# Patient Record
Sex: Female | Born: 1985 | Race: Black or African American | Hispanic: No | Marital: Married | Smoking: Never smoker
Health system: Southern US, Community
[De-identification: ages and names within clinical notes are randomized; demographics above are authoritative.]

## PROBLEM LIST (undated history)

## (undated) ENCOUNTER — Inpatient Hospital Stay (HOSPITAL_COMMUNITY): Payer: Self-pay

## (undated) DIAGNOSIS — O24419 Gestational diabetes mellitus in pregnancy, unspecified control: Secondary | ICD-10-CM

## (undated) DIAGNOSIS — E282 Polycystic ovarian syndrome: Secondary | ICD-10-CM

## (undated) DIAGNOSIS — Z8279 Family history of other congenital malformations, deformations and chromosomal abnormalities: Secondary | ICD-10-CM

## (undated) DIAGNOSIS — T8859XA Other complications of anesthesia, initial encounter: Secondary | ICD-10-CM

## (undated) DIAGNOSIS — O358XX0 Maternal care for other (suspected) fetal abnormality and damage, not applicable or unspecified: Secondary | ICD-10-CM

## (undated) DIAGNOSIS — L680 Hirsutism: Secondary | ICD-10-CM

## (undated) DIAGNOSIS — T4145XA Adverse effect of unspecified anesthetic, initial encounter: Secondary | ICD-10-CM

## (undated) DIAGNOSIS — Z8619 Personal history of other infectious and parasitic diseases: Secondary | ICD-10-CM

## (undated) DIAGNOSIS — A609 Anogenital herpesviral infection, unspecified: Secondary | ICD-10-CM

## (undated) HISTORY — DX: Other complications of anesthesia, initial encounter: T88.59XA

## (undated) HISTORY — DX: Adverse effect of unspecified anesthetic, initial encounter: T41.45XA

## (undated) HISTORY — PX: DILATION AND CURETTAGE OF UTERUS: SHX78

## (undated) HISTORY — DX: Hirsutism: L68.0

## (undated) HISTORY — PX: TONSILLECTOMY: SHX5217

## (undated) HISTORY — DX: Anogenital herpesviral infection, unspecified: A60.9

## (undated) HISTORY — DX: Polycystic ovarian syndrome: E28.2

## (undated) HISTORY — DX: Family history of other congenital malformations, deformations and chromosomal abnormalities: Z82.79

## (undated) HISTORY — DX: Gestational diabetes mellitus in pregnancy, unspecified control: O24.419

## (undated) HISTORY — PX: TONSILLECTOMY: SUR1361

## (undated) HISTORY — DX: Personal history of other infectious and parasitic diseases: Z86.19

---

## 2012-01-29 ENCOUNTER — Other Ambulatory Visit (HOSPITAL_COMMUNITY): Payer: Self-pay | Admitting: Obstetrics & Gynecology

## 2012-01-29 DIAGNOSIS — N979 Female infertility, unspecified: Secondary | ICD-10-CM

## 2012-02-04 ENCOUNTER — Ambulatory Visit (HOSPITAL_COMMUNITY)
Admission: RE | Admit: 2012-02-04 | Discharge: 2012-02-04 | Disposition: A | Discharge status: Home / Self Care | Payer: BC Managed Care – PPO | Source: Ambulatory Visit | Attending: Obstetrics & Gynecology | Admitting: Obstetrics & Gynecology

## 2012-02-04 DIAGNOSIS — N979 Female infertility, unspecified: Secondary | ICD-10-CM | POA: Insufficient documentation

## 2012-02-04 MED ORDER — IOHEXOL 300 MG/ML  SOLN: 7.0000 mL | Freq: Once | INTRAMUSCULAR | Status: AC | PRN

## 2013-04-04 LAB — OB RESULTS CONSOLE HEPATITIS B SURFACE ANTIGEN: HEP B S AG: NEGATIVE

## 2013-04-04 LAB — OB RESULTS CONSOLE RUBELLA ANTIBODY, IGM: RUBELLA: IMMUNE

## 2013-04-04 LAB — OB RESULTS CONSOLE ABO/RH: RH Type: POSITIVE

## 2013-04-04 LAB — OB RESULTS CONSOLE HIV ANTIBODY (ROUTINE TESTING): HIV: NONREACTIVE

## 2013-04-04 LAB — OB RESULTS CONSOLE ANTIBODY SCREEN: ANTIBODY SCREEN: NEGATIVE

## 2013-04-04 LAB — OB RESULTS CONSOLE RPR: RPR: NONREACTIVE

## 2013-04-18 LAB — OB RESULTS CONSOLE GC/CHLAMYDIA
CHLAMYDIA, DNA PROBE: NEGATIVE
Gonorrhea: NEGATIVE

## 2013-04-21 NOTE — L&D Delivery Note (Signed)
Delivery Note At 2:29 PM a viable and healthy female was delivered via  (Presentation: OP ).  APGAR: 8 , 8; weight 6 lb 6 oz Thick meconium noted after birth but none in infant;s nares or mouth. Placenta status: spontaneous intact, small, sending to path.  Cord: no complications: .  Cord pH: not sent Anesthesia: Epidural  Episiotomy: none Lacerations: 1st degress vaginal  Suture Repair: 3.0 vicryl rapide Est. Blood Loss (mL): 250cc  Mom to postpartum.  Baby to Couplet care / Skin to Skin.  Eilee Schader R 11/23/2013, 2:51 PM

## 2013-06-15 ENCOUNTER — Encounter: Payer: BC Managed Care – PPO | Attending: Obstetrics & Gynecology

## 2013-06-15 VITALS — Ht 62.0 in | Wt 160.5 lb

## 2013-06-15 DIAGNOSIS — O99810 Abnormal glucose complicating pregnancy: Secondary | ICD-10-CM | POA: Insufficient documentation

## 2013-06-15 DIAGNOSIS — Z713 Dietary counseling and surveillance: Secondary | ICD-10-CM | POA: Insufficient documentation

## 2013-06-23 NOTE — Progress Notes (Signed)
  Patient was seen on 06/15/13 for Gestational Diabetes self-management class at the [facility]. The following learning objectives were met by the patient during this course:   States the definition of Gestational Diabetes  States why dietary management is important in controlling blood glucose  Describes the effects of carbohydrates on blood glucose levels  Demonstrates ability to create a balanced meal plan  Demonstrates carbohydrate counting   States when to check blood glucose levels  Demonstrates proper blood glucose monitoring techniques  States the effect of stress and exercise on blood glucose levels  States the importance of limiting caffeine and abstaining from alcohol and smoking  Plan:  Aim for 2 Carb Choices per meal (30 grams) +/- 1 either way for breakfast Aim for 3 Carb Choices per meal (45 grams) +/- 1 either way from lunch and dinner Aim for 1-2 Carbs per snack Begin reading food labels for Total Carbohydrate and sugar grams of foods Consider  increasing your activity level by walking daily as tolerated Begin checking BG before breakfast and 1-2 hours after first bit of breakfast, lunch and dinner after  as directed by MD  Take medication  as directed by MD  Blood glucose monitor given:  One Touch Ultra Mini Self Monitoring Kit Lot # [id number] X Exp: 11/2013 Blood glucose reading: 63m/dl  Patient instructed to monitor glucose levels: FBS: 60 - <90 1 hour: <140 2 hour: <120  Patient received the following handouts:  Nutrition Diabetes and Pregnancy  Carbohydrate Counting List  Meal Planning worksheet  Patient will be seen for follow-up as needed.

## 2013-08-03 ENCOUNTER — Other Ambulatory Visit: Payer: Self-pay

## 2013-08-05 ENCOUNTER — Encounter (HOSPITAL_COMMUNITY): Payer: Self-pay | Admitting: Obstetrics & Gynecology

## 2013-08-05 ENCOUNTER — Other Ambulatory Visit (HOSPITAL_COMMUNITY): Payer: Self-pay | Admitting: Obstetrics & Gynecology

## 2013-08-05 DIAGNOSIS — O358XX0 Maternal care for other (suspected) fetal abnormality and damage, not applicable or unspecified: Secondary | ICD-10-CM

## 2013-08-05 DIAGNOSIS — Z3689 Encounter for other specified antenatal screening: Secondary | ICD-10-CM

## 2013-08-05 DIAGNOSIS — IMO0002 Reserved for concepts with insufficient information to code with codable children: Secondary | ICD-10-CM

## 2013-08-12 ENCOUNTER — Encounter (HOSPITAL_COMMUNITY): Payer: BC Managed Care – PPO

## 2013-08-16 ENCOUNTER — Ambulatory Visit (HOSPITAL_COMMUNITY)
Admission: RE | Admit: 2013-08-16 | Discharge: 2013-08-16 | Disposition: A | Discharge status: Home / Self Care | Payer: BC Managed Care – PPO | Source: Ambulatory Visit | Attending: Obstetrics & Gynecology | Admitting: Obstetrics & Gynecology

## 2013-08-16 ENCOUNTER — Ambulatory Visit (HOSPITAL_COMMUNITY): Admission: RE | Admit: 2013-08-16 | Payer: BC Managed Care – PPO | Source: Ambulatory Visit

## 2013-08-16 ENCOUNTER — Encounter (HOSPITAL_COMMUNITY): Payer: Self-pay

## 2013-08-16 DIAGNOSIS — O24419 Gestational diabetes mellitus in pregnancy, unspecified control: Secondary | ICD-10-CM

## 2013-08-16 DIAGNOSIS — O35EXX0 Maternal care for other (suspected) fetal abnormality and damage, fetal genitourinary anomalies, not applicable or unspecified: Secondary | ICD-10-CM

## 2013-08-16 DIAGNOSIS — IMO0002 Reserved for concepts with insufficient information to code with codable children: Secondary | ICD-10-CM

## 2013-08-16 DIAGNOSIS — O99810 Abnormal glucose complicating pregnancy: Secondary | ICD-10-CM | POA: Insufficient documentation

## 2013-08-16 DIAGNOSIS — O358XX0 Maternal care for other (suspected) fetal abnormality and damage, not applicable or unspecified: Secondary | ICD-10-CM | POA: Insufficient documentation

## 2013-08-16 DIAGNOSIS — Z3689 Encounter for other specified antenatal screening: Secondary | ICD-10-CM

## 2013-08-16 NOTE — Consult Note (Signed)
MFM consult  28 yr old G2P0010 at 28w3dwith gestational diabetes A2 and finding of pyelectasis and echogenic bowel on outside ultrasound referred by Dr. MBenjie Karvonenfor fetal anatomic survey and consult.  Ultrasound today shows: single intrauterine pregnancy. Estimated fetal weight is in the 61st%. Posterior fundal placenta without evidence of previa. Normal amniotic fluid volume. Normal transabdominal cervical length. Echogenic bowel is seen. There is bilateral dilation of the renal pelves; the right measures 8.1151m the left measures 5.51m6mThere are some views suspicious for dilated ureter on the left. The bladder is normal; there is no calyceal dilation. The remainder of the anatomic survey is normal.  I counseled the patient as follows: 1. Appropriate fetal growth. 2. Echogenic bowel: I discussed the associations of echogenic bowel with fetal aneuploidy, cystic fibrosis, congenital infection, and bleeding.  Aneuploidy is found in 3-25% of fetuses with echogenic bowel.  Cystic fibrosis is found in 3-8% of fetuses with echogenic bowel.  There is a higher risk of fetal growth restriction in fetuses with echogenic bowel (up to 20%).  Congenital infections such as toxoplasmosis and CMV have also been associated with echogenic bowel.  - patient met with the genetic counselor; see separate report - per patient had normal first trimester screen; we do not have the results - toxoplasmosis and CMV were negative for IgM - cystic fibrosis is pending - recommend fetal growth every 4 weeks - patient deciding on cell free fetal DNA 3. Urinary tract dilation: discussed slight association with fetal aneuploidy; specifically trisomy 21. Discussed the etiology could be benign finding or variant of normal. Urinary tract dilation can also be caused by obstruction or vesicoureteral reflux. I discussed that the majority of the cases resolve antepartum or shortly after delivery. A small percentage of cases may require  prophylactic antibiotics, renal ultrasound, and VCUG, or surgical correction. I have recommended the patient follow up at 32 weeks to reevaluate the fetal kidneys.  - if there is ureteral dilation would be class 2-3, if not would be class 1 as pelves are <72m20md calyces are normal - recommend reevaluate at 32 weeks to determine class - if felt to be class 2-3 would recommend antenatal Pediatric urology consult - see above for genetic screening 4. Diabetes: Discussed increased risks in pregnancy include: fetal macrosomia, shoulder dystocia, and increased risk of requiring a Cesarean delivery. There is also an increased risk of developing preeclampsia during the pregnancy and an increased risk of type II diabetes in the future. I discussed there is an increased risk of stillbirth, neonatal hypoglycemia, neonatal jaundice, and neonatal electrolyte disturbances. I recommend strict glucose control maintaining fasting blood sugars <90 and 2 hour postprandial values <120. Patient reports good control on metformin. I recommend starting fetal kick counts at 28 weeks.. I recommend starting antenatal testing with either weekly biophysical profiles or twice weekly nonstress tests and weekly amniotic fluid index at 32 weeks.  I recommend following fetal growth every 4 weeks. I recommend delivery by estimated due date but not prior to 39 weeks in the absence of other complications according to ACOG guidelines. I recommend screening for diabetes 6 weeks postpartum.   I spent a total of 60 minutes with the patient of which >50% was spent in face to face consultation.  Please call with questions.  [place]

## 2013-08-16 NOTE — Progress Notes (Signed)
[facility] ultrasound  Indication: 28 yr old G2P0010 at 68w3dwith gestational diabetes A2 and finding of pyelectasis and echogenic bowel on outside ultrasound for fetal anatomic survey.  Findings: 1. Single intrauterine pregnancy. 2. Estimated fetal weight is in the 61st%. 3. Posterior fundal placenta without evidence of previa. 4. Normal amniotic fluid volume. 5. Normal transabdominal cervical length. 6. Echogenic bowel is seen. 7. There is bilateral dilation of the renal pelves; the right measures 8.166m the left measures 5.57m52mThere are some views suspicious for dilated ureter on the left. The bladder is normal; there is no calyceal dilation. 8. The remainder of the anatomic survey is normal.  Recommendations: 1. Appropriate fetal growth. 2. Echogenic bowel: - see consult letter - patient met with the genetic counselor; see separate report - per patient had normal first trimester screen; we do not have the results - toxoplasmosis and CMV were negative for IgM - cystic fibrosis is pending - recommend fetal growth every 4 weeks - patient deciding on cell free fetal DNA 3. Urinary tract dilation: - see consult letter - if there is ureteral dilation would be class 2-3, if not would be class 1 as pelves are <54m79md calyces are normal - recommend reevaluate at 32 weeks to determine class - if felt to be class 2-3 would recommend antenatal Pediatric urology consult - see above for genetic screening 4. Diabetes: - see consult letter - on metformin - recommend fetal growth as above - recommend antenatal testing starting at 32 weeks - recommend delivery by estimated due date but not prior to 39 weeks in the absence of other complications  [place]

## 2013-10-26 LAB — OB RESULTS CONSOLE GBS: GBS: NEGATIVE

## 2013-11-10 ENCOUNTER — Telehealth (HOSPITAL_COMMUNITY): Payer: Self-pay | Admitting: *Deleted

## 2013-11-10 ENCOUNTER — Other Ambulatory Visit: Payer: Self-pay | Admitting: Obstetrics & Gynecology

## 2013-11-10 ENCOUNTER — Encounter (HOSPITAL_COMMUNITY): Payer: Self-pay | Admitting: *Deleted

## 2013-11-10 NOTE — Telephone Encounter (Signed)
Preadmission screen  

## 2013-11-19 ENCOUNTER — Inpatient Hospital Stay (HOSPITAL_COMMUNITY)
Admission: AD | Admit: 2013-11-19 | Payer: BC Managed Care – PPO | Source: Ambulatory Visit | Admitting: Obstetrics & Gynecology

## 2013-11-21 ENCOUNTER — Telehealth (HOSPITAL_COMMUNITY): Payer: Self-pay | Admitting: *Deleted

## 2013-11-21 ENCOUNTER — Inpatient Hospital Stay (HOSPITAL_COMMUNITY): Admission: RE | Admit: 2013-11-21 | Payer: BC Managed Care – PPO | Source: Ambulatory Visit

## 2013-11-21 NOTE — Telephone Encounter (Signed)
Preadmission screen  

## 2013-11-22 ENCOUNTER — Inpatient Hospital Stay (HOSPITAL_COMMUNITY)
Admission: RE | Admit: 2013-11-22 | Discharge: 2013-11-25 | DRG: 774 | Disposition: A | Discharge status: Home / Self Care | Payer: BC Managed Care – PPO | Source: Ambulatory Visit | Attending: Obstetrics & Gynecology | Admitting: Obstetrics & Gynecology

## 2013-11-22 ENCOUNTER — Encounter (HOSPITAL_COMMUNITY): Payer: Self-pay

## 2013-11-22 DIAGNOSIS — O9903 Anemia complicating the puerperium: Secondary | ICD-10-CM | POA: Diagnosis present

## 2013-11-22 DIAGNOSIS — Z810 Family history of intellectual disabilities: Secondary | ICD-10-CM

## 2013-11-22 DIAGNOSIS — Z8279 Family history of other congenital malformations, deformations and chromosomal abnormalities: Secondary | ICD-10-CM

## 2013-11-22 DIAGNOSIS — A6000 Herpesviral infection of urogenital system, unspecified: Secondary | ICD-10-CM | POA: Diagnosis present

## 2013-11-22 DIAGNOSIS — Z823 Family history of stroke: Secondary | ICD-10-CM

## 2013-11-22 DIAGNOSIS — L680 Hirsutism: Secondary | ICD-10-CM | POA: Diagnosis present

## 2013-11-22 DIAGNOSIS — D62 Acute posthemorrhagic anemia: Secondary | ICD-10-CM | POA: Diagnosis present

## 2013-11-22 DIAGNOSIS — O98519 Other viral diseases complicating pregnancy, unspecified trimester: Secondary | ICD-10-CM | POA: Diagnosis present

## 2013-11-22 DIAGNOSIS — O24419 Gestational diabetes mellitus in pregnancy, unspecified control: Secondary | ICD-10-CM

## 2013-11-22 DIAGNOSIS — O99814 Abnormal glucose complicating childbirth: Principal | ICD-10-CM | POA: Diagnosis present

## 2013-11-22 DIAGNOSIS — Z833 Family history of diabetes mellitus: Secondary | ICD-10-CM

## 2013-11-22 DIAGNOSIS — Z3490 Encounter for supervision of normal pregnancy, unspecified, unspecified trimester: Secondary | ICD-10-CM

## 2013-11-22 DIAGNOSIS — E282 Polycystic ovarian syndrome: Secondary | ICD-10-CM | POA: Diagnosis present

## 2013-11-22 LAB — GLUCOSE, CAPILLARY
Glucose-Capillary: 87 mg/dL (ref 70–99)
Glucose-Capillary: 102 mg/dL — ABNORMAL HIGH (ref 70–99)

## 2013-11-22 LAB — CBC
HCT: 36.6 % (ref 36.0–46.0)
Hemoglobin: 12.3 g/dL (ref 12.0–15.0)
MCH: 31.9 pg (ref 26.0–34.0)
MCHC: 33.6 g/dL (ref 30.0–36.0)
MCV: 95.1 fL (ref 78.0–100.0)
PLATELETS: 165 10*3/uL (ref 150–400)
RBC: 3.85 MIL/uL — ABNORMAL LOW (ref 3.87–5.11)
RDW: 14.4 % (ref 11.5–15.5)
WBC: 9.9 10*3/uL (ref 4.0–10.5)

## 2013-11-22 LAB — RPR

## 2013-11-22 MED ORDER — FLEET ENEMA 7-19 GM/118ML RE ENEM: 1.0000 | ENEMA | RECTAL | Status: DC | PRN

## 2013-11-22 MED ORDER — OXYTOCIN 40 UNITS IN LACTATED RINGERS INFUSION - SIMPLE MED
62.5000 mL/h | INTRAVENOUS | Status: DC
  Administered 2013-11-23: 62.5 mL/h via INTRAVENOUS
  Filled 2013-11-22: qty 1000

## 2013-11-22 MED ORDER — CITRIC ACID-SODIUM CITRATE 334-500 MG/5ML PO SOLN: 30.0000 mL | ORAL | Status: DC | PRN

## 2013-11-22 MED ORDER — BUTORPHANOL TARTRATE 1 MG/ML IJ SOLN
1.0000 mg | INTRAMUSCULAR | Status: DC | PRN
  Administered 2013-11-23: 1 mg via INTRAVENOUS
  Filled 2013-11-22: qty 1

## 2013-11-22 MED ORDER — OXYCODONE-ACETAMINOPHEN 5-325 MG PO TABS: 1.0000 | ORAL_TABLET | ORAL | Status: DC | PRN

## 2013-11-22 MED ORDER — LACTATED RINGERS IV SOLN
INTRAVENOUS | Status: DC
  Administered 2013-11-22 – 2013-11-23 (×4): via INTRAVENOUS

## 2013-11-22 MED ORDER — OXYTOCIN 40 UNITS IN LACTATED RINGERS INFUSION - SIMPLE MED
1.0000 m[IU]/min | INTRAVENOUS | Status: DC
  Administered 2013-11-22: 1 m[IU]/min via INTRAVENOUS
  Filled 2013-11-22: qty 1000

## 2013-11-22 MED ORDER — TERBUTALINE SULFATE 1 MG/ML IJ SOLN: 0.2500 mg | Freq: Once | INTRAMUSCULAR | Status: AC | PRN

## 2013-11-22 MED ORDER — LIDOCAINE HCL (PF) 1 % IJ SOLN
30.0000 mL | INTRAMUSCULAR | Status: DC | PRN
  Administered 2013-11-23: 30 mL via SUBCUTANEOUS
  Filled 2013-11-22: qty 30

## 2013-11-22 MED ORDER — OXYTOCIN BOLUS FROM INFUSION
500.0000 mL | INTRAVENOUS | Status: DC
  Administered 2013-11-23: 500 mL via INTRAVENOUS

## 2013-11-22 MED ORDER — ACETAMINOPHEN 325 MG PO TABS: 650.0000 mg | ORAL_TABLET | ORAL | Status: DC | PRN

## 2013-11-22 MED ORDER — MISOPROSTOL 25 MCG QUARTER TABLET
25.0000 ug | ORAL_TABLET | ORAL | Status: DC | PRN
  Administered 2013-11-22 (×3): 25 ug via VAGINAL
  Filled 2013-11-22: qty 1
  Filled 2013-11-22 (×3): qty 0.25

## 2013-11-22 MED ORDER — LACTATED RINGERS IV SOLN: 500.0000 mL | INTRAVENOUS | Status: DC | PRN

## 2013-11-22 MED ORDER — IBUPROFEN 600 MG PO TABS: 600.0000 mg | ORAL_TABLET | Freq: Four times a day (QID) | ORAL | Status: DC | PRN

## 2013-11-22 MED ORDER — ONDANSETRON HCL 4 MG/2ML IJ SOLN: 4.0000 mg | Freq: Four times a day (QID) | INTRAMUSCULAR | Status: DC | PRN

## 2013-11-22 NOTE — Progress Notes (Signed)
Patient ID: Deanna Robbins, female   DOB: January 13, 1986, 28 y.o.   MRN: [id number] Subjective: IOL for A2 GDM. Doing well. 3rd Cytotec just placed. Has contractions without any pain. No bleeding.   Objective: BP 130/70  Pulse 85  Temp(Src) 98.2 F (36.8 C) (Oral)  Resp 18  Ht 5\' 2"  (1.575 m)  Wt 180 lb (81.647 kg)  BMI 32.91 kg/m2  LMP 02/12/2013  FHT:  FHR: 130s bpm, variability: moderate,  accelerations:  Present,  decelerations:  Absent UC:   irregular, every 2-6 minutes, VTX on palpation  SVE:   Dilation: Closed Effacement (%): 20 Station: -3 Exam by:: lee   Assessment / Plan: Induction of labor due to gestational diabetes,  progressing well on pitocin. Plan pitocin after 4 hrs up and not continue Cytotec after 3rd dose. May consider foley bulb if dilates a bit  Fetal Wellbeing:  Category I EFW 7 lbs Pain Control:  None at present.   Anticipated MOD:  Attempting vaginal birth.   Deanna Robbins R 11/22/2013, 6:10 PM

## 2013-11-22 NOTE — H&P (Signed)
Deanna Robbins is a 28 y.o. female G2P0 at 40/3 wks with A2GDM, here for labor induction.  PCOS pt, infertility, On Metformin, Femara/ Ovidrel pregnancy. [facility], from 6 wks. Took Metformin in 1st trim 500mg  bid, then dropped to 250mg  bid and failed GLucola, so was back on 500mg  bid from mid pregnancy and has kept good control of BS.  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 Saw MFM, similar findings at 24 wks, declined fetal aneuploidy testing.  F/up sono at 30 wks noted pylectasis and echogenic bowel resolved.  Last sono for growth at 36 wks, EFW 6'6" at 62%, AGA, nl AFI and Vtx ANtesting with NST reactive.   History OB History   Grav Para Term Preterm Abortions TAB SAB Ect Mult Living   2 0 0 0 1 0 1 0 0 0      Past Medical History  Diagnosis Date  . Gestational diabetes mellitus, antepartum   . Gestational diabetes   . Hx of varicella   . PCOS (polycystic ovarian syndrome)   . Hirsutism   . Family history of congenital anomalies     spina bifida   Past Surgical History  Procedure Laterality Date  . Tonsillectomy    . Tonsillectomy     Family History: family history includes Cancer in her maternal aunt and other; Diabetes in her father and other; Mental retardation in her other; Sleep apnea in her other; Spina bifida in her other; Stroke in her other. Social History:  reports that she has never smoked. She has never used smokeless tobacco. She reports that she does not drink alcohol or use illicit drugs.   Prenatal Transfer Tool  Maternal Diabetes: Yes:  Diabetes Type:  Insulin/Medication controlled Metformin 500mg  bid  Genetic Screening: Declined Maternal Ultrasounds/Referrals: Abnormal:  Findings:   Fetal Kidney Anomalies, Echogenic bowel Fetal Ultrasounds or other Referrals:  Referred  to Materal Fetal Medicine  Pylectasis (bilat) and echogenic bowel, no other markers at 24 wks, all resolved at 30 wks.  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 On Valtrex since 36 wks  ROS   Neg    Dilation: Closed Effacement (%): Thick Station: Ballotable Exam by:: Montez Morita, RNC Blood pressure 141/78, pulse 91, temperature 98.4 F (36.9 C), temperature source Oral, resp. rate 18, height 5\' 2"  (1.575 m), weight 180 lb (81.647 kg), last menstrual period 02/12/2013. Exam Physical Exam  A&O x 3, no acute distress. Pleasant HEENT neg, no thyromegaly Lungs CTA bilat CV RRR, S1S2 normal Abdo soft, non tender, non acute Extr no edema/ tenderness Pelvic as above FHT  130s/ + accels/ no decels/mod variab, category I Toco q 3 min  Prenatal labs: ABO, Rh: O/Positive/-- (12/15 0000) Antibody: Negative (12/15 0000) Rubella: Immune (12/15 0000) RPR: Nonreactive (12/15 0000)  HBsAg: Negative (12/15 0000)  HIV: Non-reactive (12/15 0000)  GBS: Negative (07/08 0000)   Assessment/Plan: 28 yo, G2P0 at 40.3 wks with A2GDM, here for labor IOL. EFW 7 lbs, GBS neg.  Cytotec, then pitocin after 4 hrs if needed and ok. Station is high/ ballotable, assess descent as labor progresses.  [name] R 11/22/2013, 1:15 PM

## 2013-11-23 ENCOUNTER — Inpatient Hospital Stay (HOSPITAL_COMMUNITY): Payer: BC Managed Care – PPO | Admitting: Anesthesiology

## 2013-11-23 ENCOUNTER — Encounter (HOSPITAL_COMMUNITY): Payer: BC Managed Care – PPO | Admitting: Anesthesiology

## 2013-11-23 ENCOUNTER — Encounter (HOSPITAL_COMMUNITY): Payer: Self-pay

## 2013-11-23 DIAGNOSIS — O24419 Gestational diabetes mellitus in pregnancy, unspecified control: Secondary | ICD-10-CM

## 2013-11-23 LAB — GLUCOSE, CAPILLARY
Glucose-Capillary: 88 mg/dL (ref 70–99)
Glucose-Capillary: 91 mg/dL (ref 70–99)
Glucose-Capillary: 72 mg/dL (ref 70–99)

## 2013-11-23 LAB — ABO/RH: ABO/RH(D): O POS

## 2013-11-23 MED ORDER — SIMETHICONE 80 MG PO CHEW: 80.0000 mg | CHEWABLE_TABLET | ORAL | Status: DC | PRN

## 2013-11-23 MED ORDER — FENTANYL 2.5 MCG/ML BUPIVACAINE 1/10 % EPIDURAL INFUSION (WH - ANES)
INTRAMUSCULAR | Status: DC | PRN
  Administered 2013-11-23: 14 mL/h via EPIDURAL

## 2013-11-23 MED ORDER — FENTANYL 2.5 MCG/ML BUPIVACAINE 1/10 % EPIDURAL INFUSION (WH - ANES)
INTRAMUSCULAR | Status: DC
Start: 2013-11-23 — End: 2013-11-23
  Filled 2013-11-23: qty 125

## 2013-11-23 MED ORDER — WITCH HAZEL-GLYCERIN EX PADS: 1.0000 "application " | MEDICATED_PAD | CUTANEOUS | Status: DC | PRN

## 2013-11-23 MED ORDER — FENTANYL 2.5 MCG/ML BUPIVACAINE 1/10 % EPIDURAL INFUSION (WH - ANES)
14.0000 mL/h | INTRAMUSCULAR | Status: DC | PRN
  Administered 2013-11-23: 14 mL/h via EPIDURAL

## 2013-11-23 MED ORDER — LACTATED RINGERS IV SOLN
500.0000 mL | Freq: Once | INTRAVENOUS | Status: AC
  Administered 2013-11-23: 500 mL via INTRAVENOUS

## 2013-11-23 MED ORDER — ONDANSETRON HCL 4 MG/2ML IJ SOLN: 4.0000 mg | INTRAMUSCULAR | Status: DC | PRN

## 2013-11-23 MED ORDER — ONDANSETRON HCL 4 MG PO TABS: 4.0000 mg | ORAL_TABLET | ORAL | Status: DC | PRN

## 2013-11-23 MED ORDER — BENZOCAINE-MENTHOL 20-0.5 % EX AERO
1.0000 "application " | INHALATION_SPRAY | CUTANEOUS | Status: DC | PRN
  Administered 2013-11-23: 1 via TOPICAL
  Filled 2013-11-23: qty 56

## 2013-11-23 MED ORDER — PHENYLEPHRINE 40 MCG/ML (10ML) SYRINGE FOR IV PUSH (FOR BLOOD PRESSURE SUPPORT)
PREFILLED_SYRINGE | INTRAVENOUS | Status: AC
  Filled 2013-11-23: qty 10

## 2013-11-23 MED ORDER — TETANUS-DIPHTH-ACELL PERTUSSIS 5-2.5-18.5 LF-MCG/0.5 IM SUSP: 0.5000 mL | Freq: Once | INTRAMUSCULAR | Status: DC

## 2013-11-23 MED ORDER — OXYCODONE-ACETAMINOPHEN 5-325 MG PO TABS: 1.0000 | ORAL_TABLET | ORAL | Status: DC | PRN

## 2013-11-23 MED ORDER — FENTANYL CITRATE 0.05 MG/ML IJ SOLN
100.0000 ug | INTRAMUSCULAR | Status: DC | PRN
  Administered 2013-11-23: 100 ug via INTRAVENOUS
  Filled 2013-11-23: qty 2

## 2013-11-23 MED ORDER — DIPHENHYDRAMINE HCL 50 MG/ML IJ SOLN: 12.5000 mg | INTRAMUSCULAR | Status: DC | PRN

## 2013-11-23 MED ORDER — PRENATAL MULTIVITAMIN CH
1.0000 | ORAL_TABLET | Freq: Every day | ORAL | Status: DC
  Administered 2013-11-24 – 2013-11-25 (×2): 1 via ORAL
  Filled 2013-11-23 (×2): qty 1

## 2013-11-23 MED ORDER — PHENYLEPHRINE 40 MCG/ML (10ML) SYRINGE FOR IV PUSH (FOR BLOOD PRESSURE SUPPORT)
80.0000 ug | PREFILLED_SYRINGE | INTRAVENOUS | Status: DC | PRN
  Filled 2013-11-23: qty 2

## 2013-11-23 MED ORDER — DIBUCAINE 1 % RE OINT: 1.0000 "application " | TOPICAL_OINTMENT | RECTAL | Status: DC | PRN

## 2013-11-23 MED ORDER — IBUPROFEN 600 MG PO TABS
600.0000 mg | ORAL_TABLET | Freq: Four times a day (QID) | ORAL | Status: DC
  Administered 2013-11-23 – 2013-11-25 (×8): 600 mg via ORAL
  Filled 2013-11-23 (×8): qty 1

## 2013-11-23 MED ORDER — FENTANYL 2.5 MCG/ML BUPIVACAINE 1/10 % EPIDURAL INFUSION (WH - ANES)
14.0000 mL/h | INTRAMUSCULAR | Status: DC | PRN
  Administered 2013-11-23: 14 mL/h via EPIDURAL
  Filled 2013-11-23: qty 125

## 2013-11-23 MED ORDER — DIPHENHYDRAMINE HCL 25 MG PO CAPS: 25.0000 mg | ORAL_CAPSULE | Freq: Four times a day (QID) | ORAL | Status: DC | PRN

## 2013-11-23 MED ORDER — LIDOCAINE HCL (PF) 1 % IJ SOLN
INTRAMUSCULAR | Status: DC | PRN
  Administered 2013-11-23 (×2): 4 mL

## 2013-11-23 MED ORDER — SENNOSIDES-DOCUSATE SODIUM 8.6-50 MG PO TABS
2.0000 | ORAL_TABLET | ORAL | Status: DC
  Administered 2013-11-24 – 2013-11-25 (×2): 2 via ORAL
  Filled 2013-11-23 (×2): qty 2

## 2013-11-23 MED ORDER — LANOLIN HYDROUS EX OINT: TOPICAL_OINTMENT | CUTANEOUS | Status: DC | PRN

## 2013-11-23 MED ORDER — ZOLPIDEM TARTRATE 5 MG PO TABS: 5.0000 mg | ORAL_TABLET | Freq: Every evening | ORAL | Status: DC | PRN

## 2013-11-23 MED ORDER — EPHEDRINE 5 MG/ML INJ
10.0000 mg | INTRAVENOUS | Status: DC | PRN
  Filled 2013-11-23: qty 2

## 2013-11-23 NOTE — Progress Notes (Signed)
Deanna Robbins is a 28 y.o. G2P0010 at 2667w4d, here for labor IOL since 8/4 am for A2-GDM.  C/o rectal pressure sicne she was 4-5 cm dilated and back pain, needing to top off epidural.   Pt is s/p Cytotec x 3 since yesterday morning and on pitocin since this morning, now at 8 mu. Epidural f/by AROM with minimal fluid at 5.30 am. Since then progressed well and is now anterior lip. But station progress is poor, baby in ROT position with non rotation, trying exaggerated Sims on left FHT category I except once prolonged decel (5-6 minutes) that was position change related and resolved after correction position f/by category I again, slight change in baseline from 130s to 140s without loss of variability or maternal fever.  BS well controlled.   A/P: A2GDM (Metformin), well controlled, EFW 7 lbs, IOL since 8/4 am. Active labor at 9+ cm with ROT position and station still at -2. Rotate patient to allow descent. FHT category I.  Anticipate SVD if rotation and descent successful. Patient aware of high station at this point.  Will discuss with Dr Cherly Hensenousins who comes on call at 1 pm.    Nam Vossler R 11/23/2013, 12:41 PM

## 2013-11-23 NOTE — Anesthesia Preprocedure Evaluation (Signed)
Anesthesia Evaluation  Patient identified by MRN, date of birth, ID band Patient awake    Reviewed: Allergy & Precautions, H&P , NPO status , Patient's Chart, lab work & pertinent test results  Airway Mallampati: II TM Distance: >3 FB Neck ROM: Full    Dental no notable dental hx.    Pulmonary neg pulmonary ROS,  breath sounds clear to auscultation  Pulmonary exam normal       Cardiovascular negative cardio ROS  Rhythm:Regular Rate:Normal     Neuro/Psych negative neurological ROS  negative psych ROS   GI/Hepatic negative GI ROS, Neg liver ROS,   Endo/Other  diabetes, Gestational, Oral Hypoglycemic Agents  Renal/GU negative Renal ROS     Musculoskeletal negative musculoskeletal ROS (+)   Abdominal   Peds  Hematology negative hematology ROS (+)   Anesthesia Other Findings   Reproductive/Obstetrics (+) Pregnancy                           Anesthesia Physical Anesthesia Plan  ASA: II  Anesthesia Plan: Epidural   Post-op Pain Management:    Induction:   Airway Management Planned:   Additional Equipment:   Intra-op Plan:   Post-operative Plan:   Informed Consent: I have reviewed the patients History and Physical, chart, labs and discussed the procedure including the risks, benefits and alternatives for the proposed anesthesia with the patient or authorized representative who has indicated his/her understanding and acceptance.     Plan Discussed with:   Anesthesia Plan Comments:         Anesthesia Quick Evaluation

## 2013-11-23 NOTE — Progress Notes (Signed)
Patient ID: Deanna Robbins, female   DOB: 1986/01/04, 28 y.o.   MRN: [id number] Subjective: Doing well, S/p epidural.   Objective: BP 125/73  Pulse 75  Temp(Src) 98.9 F (37.2 C) (Oral)  Resp 18  Ht 5\' 2"  (1.575 m)  Wt 180 lb (81.647 kg)  BMI 32.91 kg/m2  SpO2 99%  LMP 02/12/2013  FHT:  FHR: 130s bpm, variability: moderate,  accelerations:  Present,  decelerations:  Absent UC:   regular, every 3-5 minutes SVE:   Dilation: 3.5 Effacement (%): 100 Station: -3;-2 Exam by:: Dr Juliene PinaMody Cx well applied to head. AROM with no fluid.   Assessment / Plan: Induction of labor due to gestational diabetes,  progressing well on pitocin  Fetal Wellbeing:  Category I Pain Control:  Epidural  Anticipated MOD:  NSVD  Jaquil Todt R 11/23/2013, 6:44 AM

## 2013-11-23 NOTE — Anesthesia Procedure Notes (Signed)
Epidural Patient location during procedure: OB  Staffing Anesthesiologist: Kinze Labo R Performed by: anesthesiologist   Preanesthetic Checklist Completed: patient identified, pre-op evaluation, timeout performed, IV checked, risks and benefits discussed and monitors and equipment checked  Epidural Patient position: sitting Prep: site prepped and draped and DuraPrep Patient monitoring: heart rate Approach: midline Location: L3-L4 Injection technique: LOR air and LOR saline  Needle:  Needle type: Tuohy  Needle gauge: 17 G Needle length: 9 cm Needle insertion depth: 5 cm Catheter type: closed end flexible Catheter size: 19 Gauge Catheter at skin depth: 11 cm Test dose: negative  Assessment Sensory level: T8 Events: blood not aspirated, injection not painful, no injection resistance, negative IV test and no paresthesia  Additional Notes Reason for block:procedure for pain   

## 2013-11-24 LAB — CBC
HEMATOCRIT: 31.5 % — AB (ref 36.0–46.0)
HEMOGLOBIN: 10.4 g/dL — AB (ref 12.0–15.0)
MCH: 31.8 pg (ref 26.0–34.0)
MCHC: 33 g/dL (ref 30.0–36.0)
MCV: 96.3 fL (ref 78.0–100.0)
Platelets: 160 10*3/uL (ref 150–400)
RBC: 3.27 MIL/uL — AB (ref 3.87–5.11)
RDW: 14.8 % (ref 11.5–15.5)
WBC: 14.8 10*3/uL — AB (ref 4.0–10.5)

## 2013-11-24 MED ORDER — OXYCODONE-ACETAMINOPHEN 5-325 MG PO TABS: 1.0000 | ORAL_TABLET | ORAL | Status: DC | PRN

## 2013-11-24 NOTE — Anesthesia Postprocedure Evaluation (Signed)
  Anesthesia Post-op Note  Anesthesia Post Note  Patient: Deanna FiscalKelley [place]  Procedure(s) Performed: * No procedures listed *  Anesthesia type: Epidural  Patient location: Mother/Baby  Post pain: Pain level controlled  Post assessment: Post-op Vital signs reviewed  Last Vitals:  Filed Vitals:   11/24/13 0511  BP: 104/64  Pulse: 70  Temp: 36.9 C  Resp: 18    Post vital signs: Reviewed  Level of consciousness:alert  Complications: No apparent anesthesia complications

## 2013-11-24 NOTE — Lactation Note (Signed)
This note was copied from the chart of Deanna Robbins-[place]. Lactation Consultation Note New mom w/PCOS/hirsutism. DEBP set up. Mom shown how to use DEBP & how to disassemble, clean, & reassemble parts. Hand expression taught w/noted colostrum. Mom has Lg. Long nipples w/bouncy areolas. Baby latches well. Wide space noted between breast w/tubular shaped breast tissue. Tender to massage. Mom encouraged to feed baby 8-12 times/24 hours and with feeding cues.  Educated about newborn behavior. Mom reports + breast changes w/pregnancy been leaking since 4-5 month. Encouraged to call for assistance if needed and to verify proper latch.WH/LC brochure given w/resources, support groups and LC services.Referred to Baby and Me Book in Breastfeeding section Pg. 22-23 for position options and Proper latch demonstration.Encouraged comfort during BF so colostrum flows better and mom will enjoy the feeding longer. Taking deep breaths and breast massage during BF. Mom encouraged to waken baby for feeds. Mom knows to pump q3h for 10-15 min. Post-pumping.  Patient Name: Deanna [place] ZOXWR'UToday's Date: 11/24/2013 Reason for consult: Initial assessment   Maternal Data    Feeding Feeding Type: Breast Fed Length of feed: 15 min  LATCH Score/Interventions Latch: Grasps breast easily, tongue down, lips flanged, rhythmical sucking. Intervention(s): Skin to skin;Teach feeding cues;Waking techniques Intervention(s): Adjust position;Assist with latch;Breast massage;Breast compression  Audible Swallowing: A few with stimulation Intervention(s): Skin to skin Intervention(s): Hand expression;Alternate breast massage  Type of Nipple: Everted at rest and after stimulation  Comfort (Breast/Nipple): Soft / non-tender     Hold (Positioning): Assistance needed to correctly position infant at breast and maintain latch. Intervention(s): Breastfeeding basics reviewed;Support Pillows;Position options;Skin to  skin  LATCH Score: 8  Lactation Tools Discussed/Used Tools: Pump Breast pump type: Double-Electric Breast Pump Pump Review: Setup, frequency, and cleaning;Milk Storage Initiated by:: Peri JeffersonL. Loann Chahal RN Date initiated:: 11/24/13   Consult Status Consult Status: Follow-up Date: 11/24/13 Follow-up type: In-patient    Harel Repetto, Diamond NickelLAURA G 11/24/2013, 7:00 AM

## 2013-11-24 NOTE — Progress Notes (Signed)
PPD 1 SVD  S:  Reports feeling tired - no sleep yet             Tolerating po/ No nausea or vomiting             Bleeding is light             Pain controlled with motrin and percocet             Up ad lib / ambulatory / voiding QS  Newborn breast feeding  / Circumcision planned today  O:               VS: BP 104/64  Pulse 70  Temp(Src) 98.4 F (36.9 C) (Oral)  Resp 18  Ht 5\' 2"  (1.575 m)  Wt 81.647 kg (180 lb)  BMI 32.91 kg/m2  SpO2 99%  LMP 02/12/2013  Breastfeeding? Unknown   LABS:              Recent Labs  11/22/13 0820 11/24/13 0610  WBC 9.9 14.8*  HGB 12.3 10.4*  PLT 165 160               Blood type: --/--/O POS (08/04 0820)  Rubella: Immune (12/15 0000)                     I&O: Intake/Output     08/05 0701 - 08/06 0700 08/06 0701 - 08/07 0700   Blood 250    Total Output 250     Net -250                        Physical Exam:             Alert and oriented X3  Abdomen: soft, non-tender, non-distended              Fundus: firm, non-tender, U-1  Perineum: mild edema / ice pack in place  Lochia: light  Extremities: no edema, no calf pain or tenderness    A: PPD # 1              Mild ABL anemia - stable  Doing well - stable status  P: Routine post partum orders  anticipate DC in am  Deanna Robbins, Deanna Robbins CNM, MSN, [facility]ACNM 11/24/2013, 8:32 AM

## 2013-11-24 NOTE — Lactation Note (Signed)
This note was copied from the chart of Deanna Robbins. Lactation Consultation Note  Patient Name: Deanna Robbins ZOXWR'UToday's Date: 11/24/2013 Reason for consult: Follow-up assessment  Baby 27 hours of life. Mom reports nursing going well. Mom states that she has been post-pumping after each breastfeed. Enc mom to continue with post-pumping. Mom states that she isn't really seeing colostrum. Enc mom to hand express colostrum prior to latching baby. Demonstrated to mom how to flange baby's lower lip outward by tugging chin. Mom reports that she isn't having any breast/nipple discomfort. Discussed cluster-feeding with mom and to continue to feed with cues. Discussed OP/BFSG services and LC phone line for assistance. [facility] to [facility] for number of diapers to expect by day of life for baby. Enc mom to listen for swallows while nursing as well.  Maternal Data    Feeding Feeding Type:  ([facility] Length of feed: 45 min  LATCH Score/Interventions Latch: Grasps breast easily, tongue down, lips flanged, rhythmical sucking.  Audible Swallowing: Spontaneous and intermittent  Type of Nipple: Everted at rest and after stimulation  Comfort (Breast/Nipple): Soft / non-tender     Hold (Positioning): Assistance needed to correctly position infant at breast and maintain latch.  LATCH Score: 8  Lactation Tools Discussed/Used     Consult Status Consult Status: Follow-up Date: 11/25/13 Follow-up type: In-patient    Deanna Robbins, Garik Diamant 11/24/2013, 5:50 PM

## 2013-11-25 MED ORDER — POLYSACCHARIDE IRON COMPLEX 150 MG PO CAPS: 150.0000 mg | ORAL_CAPSULE | Freq: Every day | ORAL | Status: DC

## 2013-11-25 MED ORDER — IBUPROFEN 600 MG PO TABS: 600.0000 mg | ORAL_TABLET | Freq: Four times a day (QID) | ORAL | Status: DC

## 2013-11-25 NOTE — Progress Notes (Addendum)
PPD #2- SVD  Subjective:   Reports feeling well Tolerating po/ No nausea or vomiting Bleeding is light Pain controlled with Motrin Up ad lib / ambulatory / voiding without problems Newborn: breastfeeding  / Circumcision: done   Objective:   VS: VS:  Filed Vitals:   11/23/13 2157 11/24/13 0511 11/24/13 1750 11/25/13 0545  BP: 121/56 104/64 118/61 124/68  Pulse: 79 70 81 82  Temp: 98.6 F (37 C) 98.4 F (36.9 C) 98.6 F (37 C) 98.3 F (36.8 C)  TempSrc: Oral Oral Oral Oral  Resp: 18 18 20 18   Height:      Weight:      SpO2:  99%      LABS:  Recent Labs  11/24/13 0610  WBC 14.8*  HGB 10.4*  PLT 160   Blood type: --/--/O POS (08/04 0820) Rubella: Immune (12/15 0000)                I&O: Intake/Output     08/06 0701 - 08/07 0700 08/07 0701 - 08/08 0700   Blood     Total Output       Net              Physical Exam: Alert and oriented X3 Abdomen: soft, non-tender, non-distended  Fundus: firm, non-tender, U-1 Perineum: Well approximated, no significant erythema, edema, or drainage; healing well. Lochia: small Extremities: Trace BLE edema, no calf pain or tenderness    Assessment: PPD # 2 G2P1011/ S/P:spontaneous vaginal, 1st degree laceration Mild ABL anemia A2GDM, delivered-stable Doing well - stable for discharge home   Plan: Discharge home RX's:  Ibuprofen 600mg  po Q 6 hrs prn pain #30 Refill x 0 Niferex 150mg  po QD #30 Refill x 1 Routine pp visit in 6wks and glucose test 6-12 wks in office [facility] booklet given    Donette LarryBHAMBRI, Maurissa Ambrose, N MSN, CNM 11/25/2013, 10:08 AM

## 2013-11-25 NOTE — Discharge Summary (Signed)
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 Negative Homan's sign. No cords or calf tenderness. No significant calf/ankle edema.  Discharge Diagnoses: Term Pregnancy-delivered; GDM, delivered; ABL anemia  Discharge Information: Date: 11/25/2013 Activity: pelvic rest Diet: routine Medications: PNV, Ibuprofen and Iron Condition: stable Instructions: refer to practice specific booklet Discharge to: home Follow-up Information   Follow up with MODY,VAISHALI R, MD. Schedule an appointment as soon as possible for a visit in 6 weeks. (and glucose test in 6-12 weeks in office)    Specialty:  Obstetrics and Gynecology   Contact information:   [address] G[address] [place] [id number] [phone]       Newborn Data: Live born female on [birth year] Birth Weight: 6 lb 6.1 oz (2895 g) APGAR: 8, 8  Home with mother.  Deanna Robbins, N 11/25/2013, 11:16 AM

## 2013-12-09 ENCOUNTER — Ambulatory Visit (HOSPITAL_COMMUNITY)
Admission: RE | Admit: 2013-12-09 | Discharge: 2013-12-09 | Disposition: A | Discharge status: Home / Self Care | Payer: BC Managed Care – PPO | Source: Ambulatory Visit | Attending: Obstetrics & Gynecology | Admitting: Obstetrics & Gynecology

## 2013-12-09 NOTE — Lactation Note (Signed)
Lactation Consult  Mother's reason for  visit:  Follow up weight and feeding assessment Visit Type   Lactation   Appointment Notes:   Consult: follow up Lactation Consultant:  Alfred LevinsLee, Mahdiya Mossberg Anne  ________________________________________________________________________  Baby's Name: Donnella ShamKaleb Cassoday  Date of Birth: 11/23/2013  Pediatrician: Dr. Hyacinth MeekerMiller Gender: female  Gestational Age: 3429w4d (At Birth)  Birth Weight: 6 lb 6.1 oz (2895 g)  Weight at Discharge: Weight: 6 lb 3.1 oz (2810 g) Date of Discharge: 11/25/2013  [facility]   11/23/13 1429 11/24/13 0012 11/25/13 0010  Weight: 6 lb 6.1 oz (2895 g) 6 lb 5.1 oz (2865 g) 6 lb 3.1 oz (2810 g)  [facility]:  Location:Hospital   Wt 6 lbs 8 oz  On 12/02/13     Lowest weight was 5 lbs 13 oz Weight today:  7 lbs 9.5 oz   ________________________________________________________________________  Mother's Name: Carolyne FiscalKelley K [place] Type of delivery:  vaginal Breastfeeding Experience:  First baby Maternal Medical Conditions:  Polycystic ovarian syndrome Maternal Medications:  metforman 500  mg  Daily - 1/2 pill 2x day     Mom has history of PCOS  ________________________________________________________________________  Breastfeeding History (Post Discharge)     Kaleb's lowest weight after birth was 5 lbs 13 oz. He is now 7 lbs 9.5 ounces, and 5216 days old.  Mom is pumping 3-4 ounces 4 times a day, in addition to feeding baby on cue. He feeds sometime no bottles of EBM, and some days 1-2 bottles of 3-4 ounces EBM. He transferred 70 mls in 12 minutes at the breast today, and was very content.   Frequency of breastfeeding:  8 plus times a day - feeds with cues Duration of feeding:  30-40 minutes each feeding  Supplementation  none  l     2  Infant Intake and Output Assessment  Voids:  8-10 in 24 hrs.  Color:  Clear yellow Stools:  2-3 in 24 hrs.  Color:   Yellow  ________________________________________________________________________  Maternal Breast Assessment  Breast:  Full Nipple:  Erect Pain level:  0 Pain interventions:  none  _______________________________________________________________________ Feeding Assessment/Evaluation   Mom and Rhae LernerKaleb are doing very well with breast feeding. He is gaining weight well, and appears healthy. I told mom to pump if she wants to store  Some EBM, or if very full, but otherwise, just continue breast feeding him on cue.  Initial feeding assessment:  Infant's oral assessment:  WNL  Positioning:  Cradle Right breast  LATCH documentation:  Latch:  2 = Grasps breast easily, tongue down, lips flanged, rhythmical sucking.  Audible swallowing:  2 = Spontaneous and intermittent  Type of nipple:  2 = Everted at rest and after stimulation  Comfort (Breast/Nipple):  2 = Soft / non-tender  Hold (Positioning):  2 = No assistance needed to correctly position infant at breast  LATCH score:  10  Attached assessment:  Deep  Lips flanged:  Yes.    Lips untucked:  Yes.    Suck assessment:  Nutritive  Pre-feed weight: 3444 g  (7 lb. 9.5 oz.) Post-feed weight:3514   g (9 lb.11.8 oz.) Amount transferred: 70 ml Amount supplemented:  0 ml    Total amount pumped post feed:  R    L  Did not need to pump  Total amount transferred:  70 ml Total supplement given:  0 ml

## 2014-02-20 ENCOUNTER — Encounter (HOSPITAL_COMMUNITY): Payer: Self-pay

## 2014-04-05 ENCOUNTER — Emergency Department (HOSPITAL_COMMUNITY)
Admission: EM | Admit: 2014-04-05 | Discharge: 2014-04-05 | Disposition: A | Discharge status: Home / Self Care | Payer: BC Managed Care – PPO | Source: Home / Self Care | Attending: Emergency Medicine | Admitting: Emergency Medicine

## 2014-04-05 ENCOUNTER — Encounter (HOSPITAL_COMMUNITY): Payer: Self-pay | Admitting: Emergency Medicine

## 2014-04-05 DIAGNOSIS — M2662 Arthralgia of temporomandibular joint: Secondary | ICD-10-CM

## 2014-04-05 DIAGNOSIS — M26629 Arthralgia of temporomandibular joint, unspecified side: Secondary | ICD-10-CM

## 2014-04-05 NOTE — ED Provider Notes (Signed)
CSN: [id number]     Arrival date & time 04/05/14  [id number] History   First MD Initiated Contact with Patient 04/05/14 [phone]     Chief Complaint  Patient presents with  . Jaw Pain   (Consider location/radiation/quality/duration/timing/severity/associated sxs/prior Treatment) HPI Comments: 28 year old female complaining of right jaw pain for 3 days. Gradual onset. Denies toothache, fever, chills, sore throat or tongue pain. She states that the pain is deeper back, right side of her jaw beyond her teeth. Pain is worse with opening and closing the jaw and chewing. No known injury/trauma.   Past Medical History  Diagnosis Date  . Gestational diabetes mellitus, antepartum   . Gestational diabetes   . Hx of varicella   . PCOS (polycystic ovarian syndrome)   . Hirsutism   . Family history of congenital anomalies     spina bifida   Past Surgical History  Procedure Laterality Date  . Tonsillectomy    . Tonsillectomy     Family History  Problem Relation Age of Onset  . Cancer Other   . Diabetes Other   . Stroke Other   . Sleep apnea Other   . Diabetes Father   . Cancer Maternal Aunt     stomach  . Spina bifida Other     nephew  . Mental retardation Other     nephew   History  Substance Use Topics  . Smoking status: Never Smoker   . Smokeless tobacco: Never Used  . Alcohol Use: No   OB History    Gravida Para Term Preterm AB TAB SAB Ectopic Multiple Living   2 1 1  0 1 0 1 0 0 1     Review of Systems  Constitutional: Negative.   HENT: Negative.  Negative for dental problem.   Respiratory: Negative.   All other systems reviewed and are negative.   Allergies  Review of patient's allergies indicates no known allergies.  Home Medications   Prior to Admission medications   Medication Sig Start Date End Date Taking? Authorizing Provider  ibuprofen (ADVIL,MOTRIN) 600 MG tablet Take 1 tablet (600 mg total) by mouth every 6 (six) hours. 11/25/13   Lawernce PittsMelanie N Bhambri, CNM  iron  polysaccharides (NIFEREX) 150 MG capsule Take 1 capsule (150 mg total) by mouth daily. 11/25/13   Lawernce PittsMelanie N Bhambri, CNM  Prenat w/o A-FeCb-FeGl-DSS-FA (CITRANATAL RX PO) Take 1 each by mouth daily.    Historical Provider, MD   BP 151/96 mmHg  Pulse 96  Temp(Src) 99 F (37.2 C) (Oral)  Resp 16  SpO2 98%  LMP 03/17/2014 Physical Exam  Constitutional: She is oriented to person, place, and time. She appears well-developed and well-nourished. No distress.  HENT:  Mouth/Throat: No oropharyngeal exudate.  Bilateral TMs are normal Oropharynx with scant PND and cobblestoning. No dental tenderness. Positive tenderness to the pterygoid muscle. Tenderness to the right external jaw at the TMJ. Limitation in range of motion for opening the jaw has decreased. No trismus.  Eyes: Conjunctivae and EOM are normal.  Neck: Normal range of motion. Neck supple.  Pulmonary/Chest: Effort normal.  Lymphadenopathy:    She has no cervical adenopathy.  Neurological: She is alert and oriented to person, place, and time.  Skin: Skin is warm and dry.  Psychiatric: She has a normal mood and affect.  Nursing note and vitals reviewed.   ED Course  Procedures (including critical care time) Labs Review Labs Reviewed - No data to display  Imaging Review No results found.  MDM   1. TMJ arthralgia    Discussed bruxism, tension, stress, malocclusion. Ice for swelling Ibuprofen small doses q 8h prn while breastfeeding.  See dentinst or PCP if persistent.    Hayden Rasmussenavid Nykeem Citro, NP 04/05/14 1007

## 2014-04-05 NOTE — ED Notes (Signed)
C/o pain on right side of jaw onset 4 days Sx also include swelling Denies fevers, chills Alert, no signs of acute distress.

## 2014-04-05 NOTE — Discharge Instructions (Signed)
Temporomandibular Problems  Temporomandibular joint (TMJ) dysfunction means there are problems with the joint between your jaw and your skull. This is a joint lined by cartilage like other joints in your body but also has a small disc in the joint which keeps the bones from rubbing on each other. These joints are like other joints and can get inflamed (sore) from arthritis and other problems. When this joint gets sore, it can cause headaches and pain in the jaw and the face. CAUSES  Usually the arthritic types of problems are caused by soreness in the joint. Soreness in the joint can also be caused by overuse. This may come from grinding your teeth. It may also come from mis-alignment in the joint. DIAGNOSIS Diagnosis of this condition can often be made by history and exam. Sometimes your caregiver may need X-rays or an MRI scan to determine the exact cause. It may be necessary to see your dentist to determine if your teeth and jaws are lined up correctly. TREATMENT  Most of the time this problem is not serious; however, sometimes it can persist (become chronic). When this happens medications that will cut down on inflammation (soreness) help. Sometimes a shot of cortisone into the joint will be helpful. If your teeth are not aligned it may help for your dentist to make a splint for your mouth that can help this problem. If no physical problems can be found, the problem may come from tension. If tension is found to be the cause, biofeedback or relaxation techniques may be helpful. HOME CARE INSTRUCTIONS   Later in the day, applications of ice packs may be helpful. Ice can be used in a plastic bag with a towel around it to prevent frostbite to skin. This may be used about every 2 hours for 20 to 30 minutes, as needed while awake, or as directed by your caregiver.  Only take over-the-counter or prescription medicines for pain, discomfort, or fever as directed by your caregiver.  If physical therapy was  prescribed, follow your caregiver's directions.  Wear mouth appliances as directed if they were given. Document Released: 12/31/2000 Document Revised: 06/30/2011 Document Reviewed: 04/09/2008 ExitCare Patient Information 2015 [employer]. This information is not intended to replace advice given to you by your health care provider. Make sure you discuss any questions you have with your health care provider.  

## 2016-07-21 LAB — OB RESULTS CONSOLE GC/CHLAMYDIA
Chlamydia: NEGATIVE
Gonorrhea: NEGATIVE

## 2016-07-21 LAB — OB RESULTS CONSOLE HEPATITIS B SURFACE ANTIGEN: Hepatitis B Surface Ag: NEGATIVE

## 2016-07-21 LAB — OB RESULTS CONSOLE ABO/RH: RH TYPE: POSITIVE

## 2016-07-21 LAB — OB RESULTS CONSOLE ANTIBODY SCREEN: ANTIBODY SCREEN: NEGATIVE

## 2016-07-21 LAB — OB RESULTS CONSOLE HIV ANTIBODY (ROUTINE TESTING): HIV: NONREACTIVE

## 2016-07-21 LAB — OB RESULTS CONSOLE RUBELLA ANTIBODY, IGM: RUBELLA: IMMUNE

## 2016-07-21 LAB — OB RESULTS CONSOLE RPR: RPR: NONREACTIVE

## 2016-11-20 ENCOUNTER — Inpatient Hospital Stay (HOSPITAL_COMMUNITY)
Admission: AD | Admit: 2016-11-20 | Discharge: 2016-11-20 | Disposition: A | Discharge status: Home / Self Care | Payer: BLUE CROSS/BLUE SHIELD | Source: Ambulatory Visit | Attending: Obstetrics & Gynecology | Admitting: Obstetrics & Gynecology

## 2016-11-20 ENCOUNTER — Encounter (HOSPITAL_COMMUNITY): Payer: Self-pay | Admitting: *Deleted

## 2016-11-20 DIAGNOSIS — Z3A28 28 weeks gestation of pregnancy: Secondary | ICD-10-CM | POA: Insufficient documentation

## 2016-11-20 DIAGNOSIS — O368130 Decreased fetal movements, third trimester, not applicable or unspecified: Secondary | ICD-10-CM | POA: Diagnosis not present

## 2016-11-20 DIAGNOSIS — R82998 Other abnormal findings in urine: Secondary | ICD-10-CM

## 2016-11-20 DIAGNOSIS — N9489 Other specified conditions associated with female genital organs and menstrual cycle: Secondary | ICD-10-CM

## 2016-11-20 LAB — URINALYSIS, ROUTINE W REFLEX MICROSCOPIC
BILIRUBIN URINE: NEGATIVE
GLUCOSE, UA: 50 mg/dL — AB
Hgb urine dipstick: NEGATIVE
Ketones, ur: NEGATIVE mg/dL
NITRITE: NEGATIVE
PH: 7 (ref 5.0–8.0)
Protein, ur: NEGATIVE mg/dL
Specific Gravity, Urine: 1.003 — ABNORMAL LOW (ref 1.005–1.030)

## 2016-11-20 NOTE — MAU Note (Signed)
Patient was seen in ob office today for c/o decreased movement. Was having contractions during nst.  Sent over for further evaluation. Patient denies pain, vaginal bleeding, or discharge at this time. +FM

## 2016-11-20 NOTE — Discharge Instructions (Signed)

## 2016-11-20 NOTE — MAU Provider Note (Signed)
Chief Complaint:  Contractions   First Provider Initiated Contact with Patient 11/20/16 1753     HPI: Deanna Robbins is a 31 y.o. G3P1011 at 7028w0dwho presents to maternity admissions reporting decreased fetal movement today. .Was noted to be having some contractions on NST in office. Cervix was checked but FFn not done.  She was sent here for further monitoring and recheck of cervix. She reports good fetal movement now,  denies LOF, vaginal bleeding, vaginal itching/burning, urinary symptoms, h/a, dizziness, n/v, diarrhea, constipation or fever/chills.    Other  This is a new problem. The current episode started today. The problem has been gradually improving. Pertinent negatives include no abdominal pain, chills, fever, headaches, myalgias, nausea, urinary symptoms or vomiting. Nothing aggravates the symptoms. She has tried nothing for the symptoms.    RN Note: Patient was seen in ob office today for c/o decreased movement. Was having contractions during nst.  Sent over for further evaluation. Patient denies pain, vaginal bleeding, or discharge at this time. +FM   Past Medical History: Past Medical History:  Diagnosis Date  . Family history of congenital anomalies    spina bifida  . Gestational diabetes   . Gestational diabetes mellitus, antepartum   . Hirsutism   . Hx of varicella   . PCOS (polycystic ovarian syndrome)     Past obstetric history: OB History  Gravida Para Term Preterm AB Living  3 1 1  0 1 1  SAB TAB Ectopic Multiple Live Births  1 0 0 0 1    # Outcome Date GA Lbr Len/2nd Weight Sex Delivery Anes PTL Lv  3 Current           2 Term 11/23/13 6074w4d 02:45 / 01:22 6 lb 6.1 oz (2.895 kg) M Vag-Spont EPI  LIV     Birth Comments: caput  1 SAB 2014              Past Surgical History: Past Surgical History:  Procedure Laterality Date  . DILATION AND CURETTAGE OF UTERUS    . TONSILLECTOMY    . TONSILLECTOMY      Family History: Family History  Problem  Relation Age of Onset  . Cancer Other   . Diabetes Other   . Stroke Other   . Sleep apnea Other   . Diabetes Father   . Cancer Maternal Aunt        stomach  . Spina bifida Other        nephew  . Mental retardation Other        nephew    Social History: Social History  Substance Use Topics  . Smoking status: Never Smoker  . Smokeless tobacco: Never Used  . Alcohol use No    Allergies: No Known Allergies  Meds:  Prescriptions Prior to Admission  Medication Sig Dispense Refill Last Dose  . ibuprofen (ADVIL,MOTRIN) 600 MG tablet Take 1 tablet (600 mg total) by mouth every 6 (six) hours. 30 tablet 0 Unknown at Unknown time  . iron polysaccharides (NIFEREX) 150 MG capsule Take 1 capsule (150 mg total) by mouth daily. 30 capsule 1 Unknown at Unknown time  . Prenat w/o A-FeCb-FeGl-DSS-FA (CITRANATAL RX PO) Take 1 each by mouth daily.   Unknown at Unknown time    I have reviewed patient's Past Medical Hx, Surgical Hx, Family Hx, Social Hx, medications and allergies.   ROS:  Review of Systems  Constitutional: Negative for chills and fever.  Gastrointestinal: Negative for abdominal pain, nausea and vomiting.  Musculoskeletal: Negative for myalgias.  Neurological: Negative for headaches.   Other systems negative  Physical Exam  Patient Vitals for the past 24 hrs:  BP Temp Temp src Pulse Resp SpO2 Weight  11/20/16 1731 124/63 98.2 F (36.8 C) Oral 90 17 98 % 172 lb 0.6 oz (78 kg)   Constitutional: Well-developed, well-nourished female in no acute distress.  Cardiovascular: normal rate and rhythm Respiratory: normal effort, clear to auscultation bilaterally GI: Abd soft, non-tender, gravid appropriate for gestational age.   No rebound or guarding. MS: Extremities nontender, no edema, normal ROM Neurologic: Alert and oriented x 4.  GU: Neg CVAT.  PELVIC EXAM:   Cervix is long and closed, ballotable, soft.  Unable to palpate presenting part   FHT:  Baseline 135 , moderate  variability, accelerations present, no decelerations Contractions: q 3 mins Irregular, not felt by patient at all.     Labs:    Results for orders placed or performed during the hospital encounter of 11/20/16 (from the past 24 hour(s))  Urinalysis, Routine w reflex microscopic     Status: Abnormal   Collection Time: 11/20/16  5:29 PM  Result Value Ref Range   Color, Urine YELLOW YELLOW   APPearance HAZY (A) CLEAR   Specific Gravity, Urine 1.003 (L) 1.005 - 1.030   pH 7.0 5.0 - 8.0   Glucose, UA 50 (A) NEGATIVE mg/dL   Hgb urine dipstick NEGATIVE NEGATIVE   Bilirubin Urine NEGATIVE NEGATIVE   Ketones, ur NEGATIVE NEGATIVE mg/dL   Protein, ur NEGATIVE NEGATIVE mg/dL   Nitrite NEGATIVE NEGATIVE   Leukocytes, UA SMALL (A) NEGATIVE   RBC / HPF 0-5 0 - 5 RBC/hpf   WBC, UA 6-30 0 - 5 WBC/hpf   Bacteria, UA MANY (A) NONE SEEN   Squamous Epithelial / LPF 6-30 (A) NONE SEEN   Amorphous Crystal PRESENT    Sperm, UA PRESENT      Imaging:  No results found.  MAU Course/MDM: I have ordered labs and reviewed results. UA mostly negative but does have small leukocytes. Sent for culture.   +sperm, ? Source of cramping NST reviewed and found to be reassuring for gestational age Consult Dr Juliene PinaMody with presentation, exam findings and test results.  Treatments in MAU included NST/observation/po hydration.    Assessment: Single IUP at 253w1d Uterine cramping - Plan: Discharge patient  Leukocytes in urine - Plan: Discharge patient  Decreased fetal movement affecting management of pregnancy in third trimester, single or unspecified fetus - now moving well - Plan: Discharge patient   Plan: Discharge home Urine to culture Pelvic rest for next few days PO hydration Preterm Labor precautions and fetal kick counts Follow up in Office for prenatal visits and recheck of status, has appt in a few days  Encouraged to return here or to other Urgent Care/ED if she develops worsening of symptoms,  increase in pain, fever, or other concerning symptoms.   Pt stable at time of discharge.  Wynelle BourgeoisMarie Camrie Stock CNM, MSN Certified Nurse-Midwife 11/20/2016 5:53 PM

## 2016-11-22 LAB — CULTURE, OB URINE

## 2017-01-02 ENCOUNTER — Other Ambulatory Visit (HOSPITAL_COMMUNITY): Payer: Self-pay | Admitting: Obstetrics & Gynecology

## 2017-01-02 DIAGNOSIS — Z3A35 35 weeks gestation of pregnancy: Secondary | ICD-10-CM

## 2017-01-02 DIAGNOSIS — Z3689 Encounter for other specified antenatal screening: Secondary | ICD-10-CM

## 2017-01-02 DIAGNOSIS — IMO0002 Reserved for concepts with insufficient information to code with codable children: Secondary | ICD-10-CM

## 2017-01-13 ENCOUNTER — Ambulatory Visit (HOSPITAL_COMMUNITY): Admission: RE | Admit: 2017-01-13 | Payer: BLUE CROSS/BLUE SHIELD | Source: Ambulatory Visit

## 2017-01-13 ENCOUNTER — Encounter (HOSPITAL_COMMUNITY): Payer: Self-pay

## 2017-01-13 ENCOUNTER — Ambulatory Visit (HOSPITAL_COMMUNITY)
Admission: RE | Admit: 2017-01-13 | Discharge: 2017-01-13 | Disposition: A | Discharge status: Home / Self Care | Payer: BLUE CROSS/BLUE SHIELD | Source: Ambulatory Visit | Attending: Obstetrics & Gynecology | Admitting: Obstetrics & Gynecology

## 2017-01-13 DIAGNOSIS — Z3A35 35 weeks gestation of pregnancy: Secondary | ICD-10-CM | POA: Diagnosis not present

## 2017-01-13 DIAGNOSIS — Z363 Encounter for antenatal screening for malformations: Secondary | ICD-10-CM | POA: Diagnosis not present

## 2017-01-13 DIAGNOSIS — O358XX0 Maternal care for other (suspected) fetal abnormality and damage, not applicable or unspecified: Secondary | ICD-10-CM | POA: Diagnosis not present

## 2017-01-13 DIAGNOSIS — IMO0002 Reserved for concepts with insufficient information to code with codable children: Secondary | ICD-10-CM

## 2017-01-13 DIAGNOSIS — Z3689 Encounter for other specified antenatal screening: Secondary | ICD-10-CM

## 2017-01-16 ENCOUNTER — Encounter (HOSPITAL_COMMUNITY): Payer: Self-pay

## 2017-01-16 LAB — OB RESULTS CONSOLE GBS: GBS: POSITIVE

## 2017-02-03 ENCOUNTER — Telehealth (HOSPITAL_COMMUNITY): Payer: Self-pay | Admitting: *Deleted

## 2017-02-03 ENCOUNTER — Encounter (HOSPITAL_COMMUNITY): Payer: Self-pay | Admitting: *Deleted

## 2017-02-03 NOTE — Telephone Encounter (Signed)
Preadmission screen  

## 2017-02-05 ENCOUNTER — Other Ambulatory Visit: Payer: Self-pay | Admitting: Obstetrics and Gynecology

## 2017-02-05 ENCOUNTER — Other Ambulatory Visit: Payer: Self-pay | Admitting: Obstetrics & Gynecology

## 2017-02-05 NOTE — H&P (Signed)
Deanna Robbins is a 31 y.o. female presenting for labor IOL for low AFI, 39 wks.  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 Saw MFM. Normal 3hr GTT.  Last office sono 38 wks - EFW 7'12" at 79% and AC 98%, But HC/BPD 2-3% and FL 53%. AFI 6.5 cm. Vx. Suspect LGA and overall at 79% possible from small BPD/HC. HSV hx- no recent outbreaks/ none in preg. On Valtrex  G3P1011, 1 SAB, 1 tern SVD 6'6" Boy. H/o GDM.   OB History    Gravida Para Term Preterm AB Living   3 1 1  0 1 1   SAB TAB Ectopic Multiple Live Births   1 0 0 0 1     Past Medical History:  Diagnosis Date  . Complication of anesthesia    fentanyl made her hot and nauseated  . Family history of congenital anomalies    spina bifida  . Gestational diabetes    last pregnancy  . Gestational diabetes mellitus, antepartum   . Hirsutism   . HSV (herpes simplex virus) anogenital infection   . Hx of varicella   . PCOS (polycystic ovarian syndrome)    Past Surgical History:  Procedure Laterality Date  . DILATION AND CURETTAGE OF UTERUS    . TONSILLECTOMY    . TONSILLECTOMY     Family History: family history includes Diabetes in her father; Sleep apnea in her father; Spina bifida in her other; Stroke in her father. Social History:  reports that she has never smoked. She has never used smokeless tobacco. She reports that she does not drink alcohol or use drugs.     Maternal Diabetes: No- passed 3hr GTT Genetic Screening: NT/Ultrascreen normal. AFP1 normal.  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 Otherwise there are  no sonographic markers for aneuploidy or structural anomalies. However, evaluations should be considered suboptimal secondary to late EGA.   Amniotic fluid volume is normal, Estimated fetal weight is 2925g which is growth in the 77th percentile ---------------------------------------------------------------------- Recommendations  Discussed findings with patient. No prenatal interventions are  necessary, but urologic evaluation in the postnatal period is  necessary ----------------------------------------------------------------------                ROS  Neg  History   Last menstrual period 04/12/2016, unknown if currently breastfeeding. Exam Physical Exam   BP 126/78 (BP Location: Right Arm)   Pulse 78   Temp 98.3 F (36.8 C) (Oral)   Resp 18   Ht 5\' 2"  (1.575 m)   Wt 183 lb (83 kg)   LMP 04/12/2016   BMI 33.47 kg/m  A&O x 3, no acute distress. Pleasant HEENT neg, no thyromegaly Lungs CTA bilat CV RRR, S1S2 normal Abdo soft, non tender, non acute Extr no edema/ tenderness Pelvic Cx closed, long. High station.  FHT  140s/ category I Toco regular since Cytotec x 1 dose   Prenatal labs: ABO, Rh: O/Positive/-- (04/02 0000) Antibody: Negative (04/02 0000) Rubella: Immune (04/02 0000)  RPR: Nonreactive (04/02 0000)  HBsAg: Negative (04/02 0000)  HIV: Non-reactive (04/02 0000)  GBS: Positive (09/28 0000)   Assessment/Plan: 31 yo G3P1011,at 39 wks, labor IOL for low AFI. Cytotec. GBS(+). Needs PCN.  EFW 7'1/2 - 8 lbs with large AC (not sure if related to renal duplex system on left or possible LGA. Small HC and BPD with large AC has been reviewed in office, if labor not progressing well, possible need for C/section, risk of shoulder dystocia reviewed. Low threshold for C/section, pt accepts and agrees.  Fetal  renal anomaly. Needs Peds f/up.  Shea Evans R 02/05/2017, 7:11 PM

## 2017-02-06 ENCOUNTER — Inpatient Hospital Stay (HOSPITAL_COMMUNITY): Payer: BLUE CROSS/BLUE SHIELD

## 2017-02-06 ENCOUNTER — Encounter (HOSPITAL_COMMUNITY): Payer: Self-pay

## 2017-02-06 ENCOUNTER — Encounter (HOSPITAL_COMMUNITY)
Admission: RE | Disposition: A | Discharge status: Home / Self Care | Payer: Self-pay | Source: Ambulatory Visit | Attending: Obstetrics & Gynecology

## 2017-02-06 ENCOUNTER — Inpatient Hospital Stay (HOSPITAL_COMMUNITY)
Admission: RE | Admit: 2017-02-06 | Discharge: 2017-02-08 | DRG: 787 | Disposition: A | Discharge status: Home / Self Care | Payer: BLUE CROSS/BLUE SHIELD | Source: Ambulatory Visit | Attending: Obstetrics & Gynecology | Admitting: Obstetrics & Gynecology

## 2017-02-06 DIAGNOSIS — O358XX0 Maternal care for other (suspected) fetal abnormality and damage, not applicable or unspecified: Principal | ICD-10-CM | POA: Diagnosis present

## 2017-02-06 DIAGNOSIS — O3663X0 Maternal care for excessive fetal growth, third trimester, not applicable or unspecified: Secondary | ICD-10-CM | POA: Diagnosis present

## 2017-02-06 DIAGNOSIS — O99824 Streptococcus B carrier state complicating childbirth: Secondary | ICD-10-CM | POA: Diagnosis present

## 2017-02-06 DIAGNOSIS — O9832 Other infections with a predominantly sexual mode of transmission complicating childbirth: Secondary | ICD-10-CM | POA: Diagnosis present

## 2017-02-06 DIAGNOSIS — O26893 Other specified pregnancy related conditions, third trimester: Secondary | ICD-10-CM | POA: Diagnosis present

## 2017-02-06 DIAGNOSIS — A6000 Herpesviral infection of urogenital system, unspecified: Secondary | ICD-10-CM | POA: Diagnosis present

## 2017-02-06 DIAGNOSIS — Z3A39 39 weeks gestation of pregnancy: Secondary | ICD-10-CM | POA: Diagnosis not present

## 2017-02-06 DIAGNOSIS — O322XX0 Maternal care for transverse and oblique lie, not applicable or unspecified: Secondary | ICD-10-CM | POA: Diagnosis present

## 2017-02-06 DIAGNOSIS — Z3490 Encounter for supervision of normal pregnancy, unspecified, unspecified trimester: Secondary | ICD-10-CM | POA: Diagnosis present

## 2017-02-06 DIAGNOSIS — O35EXX0 Maternal care for other (suspected) fetal abnormality and damage, fetal genitourinary anomalies, not applicable or unspecified: Secondary | ICD-10-CM

## 2017-02-06 HISTORY — DX: Maternal care for other (suspected) fetal abnormality and damage, fetal genitourinary anomalies, not applicable or unspecified: O35.EXX0

## 2017-02-06 HISTORY — DX: Maternal care for other (suspected) fetal abnormality and damage, not applicable or unspecified: O35.8XX0

## 2017-02-06 LAB — CBC
HCT: 37.3 % (ref 36.0–46.0)
HEMOGLOBIN: 13 g/dL (ref 12.0–15.0)
MCH: 33.7 pg (ref 26.0–34.0)
MCHC: 34.9 g/dL (ref 30.0–36.0)
MCV: 96.6 fL (ref 78.0–100.0)
Platelets: 190 10*3/uL (ref 150–400)
RBC: 3.86 MIL/uL — AB (ref 3.87–5.11)
RDW: 13 % (ref 11.5–15.5)
WBC: 11.9 10*3/uL — ABNORMAL HIGH (ref 4.0–10.5)

## 2017-02-06 LAB — TYPE AND SCREEN
ABO/RH(D): O POS
Antibody Screen: NEGATIVE

## 2017-02-06 LAB — RPR: RPR: NONREACTIVE

## 2017-02-06 SURGERY — Surgical Case
Anesthesia: Spinal | Site: Abdomen | Wound class: Clean Contaminated

## 2017-02-06 MED ORDER — MORPHINE SULFATE (PF) 0.5 MG/ML IJ SOLN
INTRAMUSCULAR | Status: DC | PRN
  Administered 2017-02-06: .2 mg via INTRATHECAL

## 2017-02-06 MED ORDER — PENICILLIN G POT IN DEXTROSE 60000 UNIT/ML IV SOLN
3.0000 10*6.[IU] | INTRAVENOUS | Status: DC
  Administered 2017-02-06 (×3): 3 10*6.[IU] via INTRAVENOUS
  Filled 2017-02-06 (×6): qty 50

## 2017-02-06 MED ORDER — DIBUCAINE 1 % RE OINT: 1.0000 "application " | TOPICAL_OINTMENT | RECTAL | Status: DC | PRN

## 2017-02-06 MED ORDER — DEXAMETHASONE SODIUM PHOSPHATE 10 MG/ML IJ SOLN
INTRAMUSCULAR | Status: DC | PRN
  Administered 2017-02-06: 10 mg via INTRAVENOUS

## 2017-02-06 MED ORDER — LACTATED RINGERS IV SOLN
INTRAVENOUS | Status: DC
  Administered 2017-02-06 (×5): via INTRAVENOUS

## 2017-02-06 MED ORDER — HYDROMORPHONE HCL 1 MG/ML IJ SOLN
0.2500 mg | INTRAMUSCULAR | Status: DC | PRN
  Administered 2017-02-06: 0.5 mg via INTRAVENOUS

## 2017-02-06 MED ORDER — DIPHENHYDRAMINE HCL 25 MG PO CAPS: 25.0000 mg | ORAL_CAPSULE | Freq: Four times a day (QID) | ORAL | Status: DC | PRN

## 2017-02-06 MED ORDER — DEXAMETHASONE SODIUM PHOSPHATE 10 MG/ML IJ SOLN
INTRAMUSCULAR | Status: AC
  Filled 2017-02-06: qty 1

## 2017-02-06 MED ORDER — KETOROLAC TROMETHAMINE 30 MG/ML IJ SOLN
INTRAMUSCULAR | Status: AC
  Administered 2017-02-06: 30 mg via INTRAMUSCULAR
  Filled 2017-02-06: qty 1

## 2017-02-06 MED ORDER — FENTANYL CITRATE (PF) 100 MCG/2ML IJ SOLN
INTRAMUSCULAR | Status: AC
  Filled 2017-02-06: qty 2

## 2017-02-06 MED ORDER — SCOPOLAMINE 1 MG/3DAYS TD PT72
MEDICATED_PATCH | TRANSDERMAL | Status: DC | PRN
  Administered 2017-02-06: 1 via TRANSDERMAL

## 2017-02-06 MED ORDER — ACETAMINOPHEN 325 MG PO TABS
650.0000 mg | ORAL_TABLET | ORAL | Status: DC | PRN
  Administered 2017-02-06 – 2017-02-08 (×6): 650 mg via ORAL
  Filled 2017-02-06 (×5): qty 2

## 2017-02-06 MED ORDER — KETOROLAC TROMETHAMINE 30 MG/ML IJ SOLN: 30.0000 mg | Freq: Four times a day (QID) | INTRAMUSCULAR | Status: DC

## 2017-02-06 MED ORDER — MORPHINE SULFATE (PF) 0.5 MG/ML IJ SOLN
INTRAMUSCULAR | Status: AC
  Filled 2017-02-06: qty 10

## 2017-02-06 MED ORDER — ONDANSETRON HCL 4 MG/2ML IJ SOLN
INTRAMUSCULAR | Status: AC
  Filled 2017-02-06: qty 2

## 2017-02-06 MED ORDER — ONDANSETRON HCL 4 MG/2ML IJ SOLN: 4.0000 mg | Freq: Four times a day (QID) | INTRAMUSCULAR | Status: DC | PRN

## 2017-02-06 MED ORDER — ZOLPIDEM TARTRATE 5 MG PO TABS: 5.0000 mg | ORAL_TABLET | Freq: Every evening | ORAL | Status: DC | PRN

## 2017-02-06 MED ORDER — PRENATAL MULTIVITAMIN CH
1.0000 | ORAL_TABLET | Freq: Every day | ORAL | Status: DC
  Administered 2017-02-07 – 2017-02-08 (×2): 1 via ORAL
  Filled 2017-02-06 (×2): qty 1

## 2017-02-06 MED ORDER — TERBUTALINE SULFATE 1 MG/ML IJ SOLN: 0.2500 mg | Freq: Once | INTRAMUSCULAR | Status: DC | PRN

## 2017-02-06 MED ORDER — MENTHOL 3 MG MT LOZG: 1.0000 | LOZENGE | OROMUCOSAL | Status: DC | PRN

## 2017-02-06 MED ORDER — COCONUT OIL OIL
1.0000 "application " | TOPICAL_OIL | Status: DC | PRN
  Administered 2017-02-08: 1 via TOPICAL
  Filled 2017-02-06: qty 120

## 2017-02-06 MED ORDER — SIMETHICONE 80 MG PO CHEW
80.0000 mg | CHEWABLE_TABLET | Freq: Three times a day (TID) | ORAL | Status: DC
  Administered 2017-02-07 – 2017-02-08 (×4): 80 mg via ORAL
  Filled 2017-02-06 (×3): qty 1

## 2017-02-06 MED ORDER — ONDANSETRON HCL 4 MG/2ML IJ SOLN
INTRAMUSCULAR | Status: DC | PRN
  Administered 2017-02-06: 4 mg via INTRAVENOUS

## 2017-02-06 MED ORDER — OXYTOCIN 10 UNIT/ML IJ SOLN
INTRAMUSCULAR | Status: AC
  Filled 2017-02-06: qty 4

## 2017-02-06 MED ORDER — IBUPROFEN 600 MG PO TABS
600.0000 mg | ORAL_TABLET | Freq: Four times a day (QID) | ORAL | Status: DC
  Administered 2017-02-07 – 2017-02-08 (×6): 600 mg via ORAL
  Filled 2017-02-06 (×6): qty 1

## 2017-02-06 MED ORDER — PHENYLEPHRINE 8 MG IN D5W 100 ML (0.08MG/ML) PREMIX OPTIME
INJECTION | INTRAVENOUS | Status: AC
  Filled 2017-02-06: qty 100

## 2017-02-06 MED ORDER — MISOPROSTOL 25 MCG QUARTER TABLET
25.0000 ug | ORAL_TABLET | ORAL | Status: DC | PRN
  Administered 2017-02-06: 25 ug via VAGINAL
  Filled 2017-02-06 (×2): qty 1

## 2017-02-06 MED ORDER — OXYCODONE-ACETAMINOPHEN 5-325 MG PO TABS: 2.0000 | ORAL_TABLET | ORAL | Status: DC | PRN

## 2017-02-06 MED ORDER — HYDROMORPHONE HCL 1 MG/ML IJ SOLN
INTRAMUSCULAR | Status: AC
  Administered 2017-02-06: 0.5 mg via INTRAVENOUS
  Filled 2017-02-06: qty 0.5

## 2017-02-06 MED ORDER — PHENYLEPHRINE 8 MG IN D5W 100 ML (0.08MG/ML) PREMIX OPTIME
INJECTION | INTRAVENOUS | Status: DC | PRN
  Administered 2017-02-06: 60 ug/min via INTRAVENOUS

## 2017-02-06 MED ORDER — LACTATED RINGERS IV SOLN
INTRAVENOUS | Status: DC
  Administered 2017-02-07: 05:00:00 via INTRAVENOUS

## 2017-02-06 MED ORDER — OXYTOCIN 40 UNITS IN LACTATED RINGERS INFUSION - SIMPLE MED: 2.5000 [IU]/h | INTRAVENOUS | Status: AC

## 2017-02-06 MED ORDER — PHENYLEPHRINE 40 MCG/ML (10ML) SYRINGE FOR IV PUSH (FOR BLOOD PRESSURE SUPPORT)
PREFILLED_SYRINGE | INTRAVENOUS | Status: DC | PRN
  Administered 2017-02-06 (×2): 80 ug via INTRAVENOUS

## 2017-02-06 MED ORDER — SCOPOLAMINE 1 MG/3DAYS TD PT72
MEDICATED_PATCH | TRANSDERMAL | Status: AC
  Filled 2017-02-06: qty 2

## 2017-02-06 MED ORDER — TETANUS-DIPHTH-ACELL PERTUSSIS 5-2.5-18.5 LF-MCG/0.5 IM SUSP: 0.5000 mL | Freq: Once | INTRAMUSCULAR | Status: DC

## 2017-02-06 MED ORDER — SIMETHICONE 80 MG PO CHEW
80.0000 mg | CHEWABLE_TABLET | ORAL | Status: DC
  Administered 2017-02-08: 80 mg via ORAL
  Filled 2017-02-06: qty 1

## 2017-02-06 MED ORDER — SIMETHICONE 80 MG PO CHEW: 80.0000 mg | CHEWABLE_TABLET | ORAL | Status: DC | PRN

## 2017-02-06 MED ORDER — WITCH HAZEL-GLYCERIN EX PADS: 1.0000 "application " | MEDICATED_PAD | CUTANEOUS | Status: DC | PRN

## 2017-02-06 MED ORDER — LACTATED RINGERS IV SOLN: 500.0000 mL | INTRAVENOUS | Status: DC | PRN

## 2017-02-06 MED ORDER — OXYTOCIN 10 UNIT/ML IJ SOLN
INTRAVENOUS | Status: DC | PRN
  Administered 2017-02-06: 40 [IU] via INTRAVENOUS

## 2017-02-06 MED ORDER — PENICILLIN G POTASSIUM 5000000 UNITS IJ SOLR
5.0000 10*6.[IU] | Freq: Once | INTRAVENOUS | Status: AC
  Administered 2017-02-06: 5 10*6.[IU] via INTRAVENOUS
  Filled 2017-02-06: qty 5

## 2017-02-06 MED ORDER — SOD CITRATE-CITRIC ACID 500-334 MG/5ML PO SOLN
30.0000 mL | ORAL | Status: DC | PRN
  Administered 2017-02-06: 30 mL via ORAL
  Filled 2017-02-06: qty 15

## 2017-02-06 MED ORDER — ACETAMINOPHEN 325 MG PO TABS: 650.0000 mg | ORAL_TABLET | ORAL | Status: DC | PRN

## 2017-02-06 MED ORDER — OXYCODONE-ACETAMINOPHEN 5-325 MG PO TABS
1.0000 | ORAL_TABLET | ORAL | Status: DC | PRN
  Administered 2017-02-08: 1 via ORAL
  Filled 2017-02-06: qty 1

## 2017-02-06 MED ORDER — CEFAZOLIN SODIUM-DEXTROSE 2-4 GM/100ML-% IV SOLN
2.0000 g | Freq: Once | INTRAVENOUS | Status: AC
  Administered 2017-02-06: 2 g via INTRAVENOUS

## 2017-02-06 MED ORDER — KETOROLAC TROMETHAMINE 30 MG/ML IJ SOLN
30.0000 mg | Freq: Four times a day (QID) | INTRAMUSCULAR | Status: DC
  Administered 2017-02-06: 30 mg via INTRAMUSCULAR

## 2017-02-06 MED ORDER — FENTANYL CITRATE (PF) 100 MCG/2ML IJ SOLN
INTRAMUSCULAR | Status: DC | PRN
  Administered 2017-02-06: 10 ug via INTRATHECAL

## 2017-02-06 MED ORDER — SENNOSIDES-DOCUSATE SODIUM 8.6-50 MG PO TABS
2.0000 | ORAL_TABLET | ORAL | Status: DC
  Administered 2017-02-08: 2 via ORAL
  Filled 2017-02-06: qty 2

## 2017-02-06 MED ORDER — BUPIVACAINE IN DEXTROSE 0.75-8.25 % IT SOLN
INTRATHECAL | Status: DC | PRN
  Administered 2017-02-06: 1.5 mg via INTRATHECAL

## 2017-02-06 SURGICAL SUPPLY — 35 items
BENZOIN TINCTURE PRP APPL 2/3 (GAUZE/BANDAGES/DRESSINGS) ×3 IMPLANT
CHLORAPREP W/TINT 26ML (MISCELLANEOUS) ×3 IMPLANT
CLAMP CORD UMBIL (MISCELLANEOUS) IMPLANT
CLOSURE WOUND 1/2 X4 (GAUZE/BANDAGES/DRESSINGS) ×1
CLOTH BEACON ORANGE TIMEOUT ST (SAFETY) ×3 IMPLANT
CONTAINER PREFILL 10% NBF 15ML (MISCELLANEOUS) IMPLANT
DRSG OPSITE POSTOP 4X10 (GAUZE/BANDAGES/DRESSINGS) ×3 IMPLANT
ELECT REM PT RETURN 9FT ADLT (ELECTROSURGICAL) ×3
ELECTRODE REM PT RTRN 9FT ADLT (ELECTROSURGICAL) ×1 IMPLANT
EXTRACTOR VACUUM KIWI (MISCELLANEOUS) IMPLANT
EXTRACTOR VACUUM M CUP 4 TUBE (SUCTIONS) IMPLANT
EXTRACTOR VACUUM M CUP 4' TUBE (SUCTIONS)
GLOVE BIO SURGEON STRL SZ7 (GLOVE) ×3 IMPLANT
GLOVE BIOGEL PI IND STRL 7.0 (GLOVE) ×2 IMPLANT
GLOVE BIOGEL PI INDICATOR 7.0 (GLOVE) ×4
GOWN STRL REUS W/TWL LRG LVL3 (GOWN DISPOSABLE) ×6 IMPLANT
KIT ABG SYR 3ML LUER SLIP (SYRINGE) IMPLANT
NEEDLE HYPO 25X5/8 SAFETYGLIDE (NEEDLE) IMPLANT
NS IRRIG 1000ML POUR BTL (IV SOLUTION) ×3 IMPLANT
PACK C SECTION WH (CUSTOM PROCEDURE TRAY) ×3 IMPLANT
PAD OB MATERNITY 4.3X12.25 (PERSONAL CARE ITEMS) ×3 IMPLANT
RTRCTR C-SECT PINK 25CM LRG (MISCELLANEOUS) ×3 IMPLANT
STRIP CLOSURE SKIN 1/2X4 (GAUZE/BANDAGES/DRESSINGS) ×2 IMPLANT
SUT MON AB-0 CT1 36 (SUTURE) ×6 IMPLANT
SUT PLAIN 0 NONE (SUTURE) IMPLANT
SUT PLAIN 2 0 (SUTURE)
SUT PLAIN ABS 2-0 CT1 27XMFL (SUTURE) IMPLANT
SUT VIC AB 0 CT1 27 (SUTURE) ×4
SUT VIC AB 0 CT1 27XBRD ANBCTR (SUTURE) ×2 IMPLANT
SUT VIC AB 2-0 CT1 27 (SUTURE) ×2
SUT VIC AB 2-0 CT1 TAPERPNT 27 (SUTURE) ×1 IMPLANT
SUT VIC AB 4-0 KS 27 (SUTURE) ×3 IMPLANT
SUT VICRYL 0 TIES 12 18 (SUTURE) IMPLANT
TOWEL OR 17X24 6PK STRL BLUE (TOWEL DISPOSABLE) ×3 IMPLANT
TRAY FOLEY BAG SILVER LF 14FR (SET/KITS/TRAYS/PACK) IMPLANT

## 2017-02-06 NOTE — Addendum Note (Signed)
Addendum  created 02/06/17 2243 by Armanda HeritageSterling, Niv Darley M, CRNA   Anesthesia Intra Meds edited

## 2017-02-06 NOTE — Op Note (Addendum)
Cesarean Section Procedure Note   Deanna Robbins  02/06/2017 Procedure: Low Transverse C-section                      (2 layer closure)  Indications: 39.1 wks, labor induction for low AFI. Oblique lie with contractions    Pre-operative Diagnosis: cesarean section oblique lie in labor.                                               Fetal renal anomaly  Post-operative Diagnosis: Same   Surgeon: Shea Evans, MD  Assistants: Arlan Organ. CNM  Anesthesia: spinal   Procedure Details:  The patient was seen in the Holding Room. The risks, benefits, complications, treatment options, and expected outcomes were discussed with the patient. The patient concurred with the proposed plan, giving informed consent. identified as Deanna Robbins and the procedure verified as C-Section Delivery. A Time Out was held and the above information confirmed. 2 gm Ancef given.  After induction of anesthesia, the patient was draped and prepped in the usual sterile manner and foley was placed. A Pfannenstiel Incision was made and carried down through the subcutaneous tissue to the fascia. Fascial incision was made and extended transversely. The fascia was separated from the underlying rectus tissue superiorly and inferiorly. The peritoneum was identified and entered. Peritoneal incision was extended longitudinally. Alexis retractor placed. The utero-vesical peritoneal reflection was incised transversely and the bladder flap was bluntly freed from the lower uterine segment. A low transverse uterine incision was made. Large lower segment sinuses started to bleed and Allis clamp applied. Thick meconium stained fluid noted at amniotomy. Baby was unengaged and oblique cephalic, uterine fundus was shifted to bring the head at the lower segment before hysterotomy and pressure was maintained prevent head from moving. Unflexed head was rotated and brought to the incision but needed one gentle pull of Kiwi Vacuum to  complete head delivery. Infant delivered without difficulty. Cried well at birth and hence 1 minute delayed cord clamping done. Baby handed off to NICU team in attendance. Cord blood sent. Cord pH not indicated. Apgar scores of 8 at one minute and 9 at five minutes. The placenta was removed Intact and appeared normal. The uterine outline, tubes and ovaries appeared normal, ovaries had classic polycystic appearance. The uterine incision was closed with running locked sutures of 0-Monocryl followed by a second imbricating layer. Hemostasis was observed. Lavage was carried out until clear. Alexis retractor removed. Peritoneal closure done with 2-0 Vicryl. . The fascia was then reapproximated with running sutures of 0Vicryl. The subcuticular closure was performed using 2-0plain gut. The skin was closed with 4-0Vicryl. Steristrips and Honeycomb dressing placed.   Instrument, sponge, and needle counts were correct prior the abdominal closure and were correct at the conclusion of the case.    Findings: Female infant delivered at 19.56 hours on 02/06/17 with one Kiwi vacuum assistance from [place] hysterotomy, vigorous cry and movements. Thick meconium fluid. Apgars 8, 9. Placenta, cord, tubes normal. Ovaries large, bulky and polycystic.    Estimated Blood Loss: 800 mL   Total IV Fluids: 2100 cc LR  Urine Output: 500 ccCC OF clear urine  Specimens: cord blood  Complications: no complications  Disposition: PACU - hemodynamically stable.   Maternal Condition: stable   Baby condition / location:  Couplet care / Skin to Skin  Attending Attestation: I performed the procedure.   Signed: Surgeon(s): Shea EvansMody, Jonathin Heinicke, MD

## 2017-02-06 NOTE — Anesthesia Procedure Notes (Signed)
Spinal  Patient location during procedure: OR Start time: 02/06/2017 7:25 PM End time: 02/06/2017 7:32 PM Staffing Anesthesiologist: Marcene DuosFITZGERALD, Marquesha Robideau Performed: anesthesiologist  Preanesthetic Checklist Completed: patient identified, site marked, surgical consent, pre-op evaluation, timeout performed, IV checked, risks and benefits discussed and monitors and equipment checked Spinal Block Patient position: sitting Prep: site prepped and draped and DuraPrep Patient monitoring: blood pressure, continuous pulse ox and heart rate Approach: midline Location: L4-5 Injection technique: single-shot Needle Needle type: Pencan  Needle gauge: 24 G Needle length: 9 cm Needle insertion depth: 7 cm Assessment Sensory level: T6

## 2017-02-06 NOTE — Progress Notes (Signed)
Patient ID: Jonni SangerKelley Robbins, female   DOB: [birth year], 31 y.o.   MRN: [id number]  Pt here for IOL for Low AFI and fetal renal anomaly S/p overnight Cytotec.   Per RN not feeling presenting part at night or this AM at 7.30 am So I examined pt and performed bedside sono at 7.45 am. Vtx but oblique lie, cervix closed, not easy to palpate, no presenting part at inlet.   She was advised to ambulate, shower, eat and continue to assess if fetal engagement starts.   Pt reassessed.  Per RN exam 1 hr back is unchanged with no fetal presenting part in pelvis.   Plan to reassess at 3 pm and if no change will proceed with C-section. Per Anesthesia at 5 pm since she ate bacon.

## 2017-02-06 NOTE — Transfer of Care (Signed)
Immediate Anesthesia Transfer of Care Note  Patient: Deanna SangerKelley [place]  Procedure(s) Performed: CESAREAN SECTION (N/A Abdomen)  Patient Location: PACU  Anesthesia Type:Spinal  Level of Consciousness: awake, alert  and oriented  Airway & Oxygen Therapy: Patient Spontanous Breathing  Post-op Assessment: Post -op Vital signs reviewed and stable  Post vital signs: Reviewed and stable  Last Vitals:  Vitals:   02/06/17 1807 02/06/17 1856  BP: 129/80 121/64  Pulse: 92 98  Resp:    Temp:  36.8 C    Last Pain:  Vitals:   02/06/17 1856  TempSrc: Oral  PainSc:          Complications: No apparent anesthesia complications

## 2017-02-06 NOTE — Progress Notes (Signed)
Dr. Juliene PinaMody in to see patient and perform cervical exam. Unable to assess cervix or presentation, so bedside ultrasound performed. Fetus is head down but not in pelvis, more of an oblique lie. Per Mody, patient can eat regular breakfast and shower. Patient will also walk and sit on birthing ball to attempt to move head into pelvis. Dr Juliene PinaMody educated patient that if unsuccessful, a Cesarean could be an option. Patient voiced understanding and had no further questions.  [address]Amber Robbins Chin[place], [place]RN 02/06/2017 [phone]

## 2017-02-06 NOTE — Anesthesia Pain Management Evaluation Note (Signed)
  CRNA Pain Management Visit Note  Patient: Deanna Robbins, 31 y.o., female  "Hello I am a [facility] the [facility]. We have an anesthesia team available at all times to provide care throughout the hospital, including epidural management and anesthesia for C-section. I don't know your plan for the delivery whether it a natural birth, water birth, IV sedation, nitrous supplementation, doula or epidural, but we want to meet your pain goals."   1.Was your pain managed to your expectations on prior hospitalizations?   Yes   2.What is your expectation for pain management during this hospitalization?     Epidural  3.How can we help you reach that goal? epidural  Record the patient's initial score and the patient's pain goal.   Pain: 5  Pain Goal: 8 [facility] wants you to be able to say your pain was always managed very well.  Deanna Robbins 02/06/2017

## 2017-02-06 NOTE — Anesthesia Preprocedure Evaluation (Signed)
Anesthesia Evaluation  Patient identified by MRN, date of birth, ID band Patient awake    Reviewed: Allergy & Precautions, H&P , Patient's Chart, lab work & pertinent test results, reviewed documented beta blocker date and time   Airway Mallampati: II  TM Distance: >3 FB Neck ROM: full    Dental no notable dental hx.    Pulmonary    Pulmonary exam normal breath sounds clear to auscultation       Cardiovascular  Rhythm:regular Rate:Normal     Neuro/Psych    GI/Hepatic   Endo/Other  diabetes, Gestational  Renal/GU      Musculoskeletal   Abdominal   Peds  Hematology   Anesthesia Other Findings   Reproductive/Obstetrics                             Anesthesia Physical Anesthesia Plan  ASA: II  Anesthesia Plan: Spinal   Post-op Pain Management:    Induction:   PONV Risk Score and Plan:   Airway Management Planned:   Additional Equipment:   Intra-op Plan:   Post-operative Plan:   Informed Consent: I have reviewed the patients History and Physical, chart, labs and discussed the procedure including the risks, benefits and alternatives for the proposed anesthesia with the patient or authorized representative who has indicated his/her understanding and acceptance.   Dental Advisory Given  Plan Discussed with: CRNA and Surgeon  Anesthesia Plan Comments: (  )        Anesthesia Quick Evaluation

## 2017-02-06 NOTE — Anesthesia Postprocedure Evaluation (Signed)
Anesthesia Post Note  Patient: Deanna Robbins  Procedure(s) Performed: CESAREAN SECTION (N/A Abdomen)     Patient location during evaluation: PACU Anesthesia Type: Spinal Level of consciousness: awake and alert Pain management: pain level controlled Vital Signs Assessment: post-procedure vital signs reviewed and stable Respiratory status: spontaneous breathing and respiratory function stable Cardiovascular status: blood pressure returned to baseline and stable Postop Assessment: spinal receding Anesthetic complications: no    Last Vitals:  Vitals:   02/06/17 2145 02/06/17 2200  BP: 114/62 115/69  Pulse: 69 76  Resp: 19 18  Temp: 37 C   SpO2: 100%     Last Pain:  Vitals:   02/06/17 2209  TempSrc:   PainSc: 6    Pain Goal:                 Kennieth RadFitzgerald, Kyesha Balla E

## 2017-02-06 NOTE — Progress Notes (Addendum)
Patient ID: Jonni SangerKelley [place], female   DOB: 03-15-1986, 31 y.o.   MRN: [id number]  BP 121/72   Pulse 85   Temp 98.3 F (36.8 C) (Oral)   Resp 18   Ht 5\' 2"  (1.575 m)   Wt 183 lb (83 kg)   LMP 04/12/2016   BMI 33.47 kg/m  FHT still category I  Case delayed since 2 other emergencies going to OR that will go before us. Unsure time of start.   V.Tomicka Lover, MD

## 2017-02-06 NOTE — Progress Notes (Signed)
Patient ID: Deanna Robbins, female   DOB: Jul 06, 1985, 31 y.o.   MRN: [id number]  Pt here for IOL for Low AFI and fetal renal anomaly. S/p overnight Cytotec. Pt ambulated, sat on birthing ball etc.   BP 121/72   Pulse 85   Temp 98.3 F (36.8 C) (Oral)   Resp 18   Ht 5\' 2"  (1.575 m)   Wt 183 lb (83 kg)   LMP 04/12/2016   BMI 33.47 kg/m  FHT- category I UCs spaced out.  Vtx but oblique lie, cervix closed, head not reached per vaginal exam, no presenting part at inlet.   Proceed with C-section. Per Anesthesia at 5 pm since she ate bacon at 9 am, needs 8 hrs NPO.

## 2017-02-07 LAB — CBC
HCT: 32.9 % — ABNORMAL LOW (ref 36.0–46.0)
Hemoglobin: 11.1 g/dL — ABNORMAL LOW (ref 12.0–15.0)
MCH: 32 pg (ref 26.0–34.0)
MCHC: 33.7 g/dL (ref 30.0–36.0)
MCV: 94.8 fL (ref 78.0–100.0)
PLATELETS: 175 10*3/uL (ref 150–400)
RBC: 3.47 MIL/uL — AB (ref 3.87–5.11)
RDW: 13.1 % (ref 11.5–15.5)
WBC: 13.4 10*3/uL — AB (ref 4.0–10.5)

## 2017-02-07 NOTE — Progress Notes (Signed)
Subjective: POD# 1 Information for the patient's newborn:  Deanna Robbins, Deanna Robbins [[id number]][[id number]]  female    circ pending  Reports feeling sore, incision site burning, medications helped.  Feeding: breast Patient reports tolerating PO.  Breast symptoms: none Pain controlled with PO meds Denies HA/SOB/C/P/N/V/dizziness. Flatus absent. She reports vaginal bleeding as normal, without clots.  [facility]  Objective:   VS:    Vitals:   02/07/17 0445 02/07/17 0453 02/07/17 0454 02/07/17 0845  BP: (!) 109/55 (!) 106/57 108/62 (!) 104/49  Pulse: 66 70 74 62  Resp: 18 18 18 16   Temp: 98.5 F (36.9 C)   98.4 F (36.9 C)  TempSrc: Oral   Oral  SpO2: 95%   98%  Weight:      Height:         Intake/Output Summary (Last 24 hours) at 02/07/17 1140 Last data filed at 02/07/17 0845  Gross per 24 hour  Intake          3720.83 ml  Output             2325 ml  Net          1395.83 ml        Recent Labs  02/06/17 0130 02/07/17 0524  WBC 11.9* 13.4*  HGB 13.0 11.1*  HCT 37.3 32.9*  PLT 190 175     Blood type: --/--/O POS (10/19 0130)  Rubella: Immune (04/02 0000)     Physical Exam:  General: alert, cooperative and no distress CV: Regular rate and rhythm Resp: clear Abdomen: Mild tender, + moderate gas distention, + BS x 4 Q Incision: clean, dry and intact Uterine Fundus: firm, below umbilicus, appropriate tenderness Lochia: minimal Ext: no edema, redness or tenderness in the calves or thighs      Assessment/Plan: 31 y.o.   POD# 1. [id number]                  Principal Problem:   Postpartum care following cesarean delivery 10/19 Active Problems:   Fetal renal anomaly, single gestation   Cesarean delivery delivered - Indication: unstable lie   Doing well, stable.    Saline lock IV site DC foley cath Abdominal binder for ambulation          Advance diet as tolerated Encourage rest when baby rests Breastfeeding support Encourage to  ambulate, warm fluids for gut motility Routine post-op care  Neta Mendsaniela C Kenlie Seki, CNM, MSN 02/07/2017, 11:40 AM

## 2017-02-07 NOTE — Addendum Note (Signed)
Addendum  created 02/07/17 [id number] by Algis GreenhouseBurger, Saanvi Hakala A, CRNA   Sign clinical note

## 2017-02-07 NOTE — Anesthesia Postprocedure Evaluation (Signed)
Anesthesia Post Note  Patient: Deanna Robbins  Procedure(s) Performed: CESAREAN SECTION (N/A Abdomen)     Patient location during evaluation: Mother Baby Anesthesia Type: Spinal Level of consciousness: awake Pain management: satisfactory to patient Vital Signs Assessment: post-procedure vital signs reviewed and stable Respiratory status: spontaneous breathing Cardiovascular status: stable Anesthetic complications: no    Last Vitals:  Vitals:   02/07/17 0453 02/07/17 0454  BP: (!) 106/57 108/62  Pulse: 70 74  Resp: 18 18  Temp:    SpO2:      Last Pain:  Vitals:   02/07/17 0445  TempSrc: Oral  PainSc: 3    Pain Goal:                 Cephus ShellingBURGER,Dannon Perlow

## 2017-02-07 NOTE — Lactation Note (Signed)
This note was copied from a baby's chart. Lactation Consultation Note Experienced BF mom BF her now 89 yr old for 15 months. Mom has PCOS dx but had no milk supply issues. Didn't require supplementing w/formula.  DEBP discussed. Mom shown how to use DEBP & how to disassemble, clean, & reassemble parts. Mom knows to pump q3h for 15-20 min.  Mom encouraged to feed baby 8-12 times/24 hours and with feeding cues. Encouraged mom to stimulate baby to feed if hasn't cued in 3 hrs. Reviewed newborn feeding habits, behavior, STS, I&O, cluster feeding, supply and demand.   Mom has round breast, elongated everted nipples. Rt. Nipple shaft long and hangs down against breast. Long as a bottle nipple. Baby latched well. Noted some softening of breast after BF. Unable to obtain colostrum to RT. Breast. Expressed a dot of colostrum to Lt. Breast. Encouraged to do occasionaly breast massage during feeding. Encouraged mom to call for assistance or questions if needed.  [facility]/LC brochure given w/resources, support groups and LC services.  Patient Name: Deanna Robbins Reason for consult: Initial assessment   Maternal Data Has patient been taught Hand Expression?: Yes Does the patient have breastfeeding experience prior to this delivery?: Yes  Feeding Feeding Type: Breast Fed Length of feed: 15 min  LATCH Score Latch: Repeated attempts needed to sustain latch, nipple held in mouth throughout feeding, stimulation needed to elicit sucking reflex.  Audible Swallowing: A few with stimulation  Type of Nipple: Everted at rest and after stimulation  Comfort (Breast/Nipple): Soft / non-tender  Hold (Positioning): Assistance needed to correctly position infant at breast and maintain latch.  LATCH Score: 7  Interventions Interventions: Breast feeding basics reviewed;Breast compression;Assisted with latch;Adjust position;Skin to skin;Support pillows;DEBP;Breast massage;Position  options;Hand express  Lactation Tools Discussed/Used Tools: Pump;Flanges Flange Size: 27 Breast pump type: Double-Electric Breast Pump WIC Program: No Pump Review: Setup, frequency, and cleaning;Milk Storage Initiated by:: Peri JeffersonL. Shylo Zamor RN IBCLC Date initiated:: 02/07/17   Consult Status Consult Status: Follow-up Date: 02/08/17 Follow-up type: In-patient    Deanna Robbins, Deanna Robbins Robbins, 1:43 AM

## 2017-02-08 ENCOUNTER — Encounter (HOSPITAL_COMMUNITY): Payer: Self-pay | Admitting: Obstetrics & Gynecology

## 2017-02-08 MED ORDER — ACETAMINOPHEN 325 MG PO TABS: 650.0000 mg | ORAL_TABLET | ORAL | Status: AC | PRN

## 2017-02-08 MED ORDER — IBUPROFEN 600 MG PO TABS: 600.0000 mg | ORAL_TABLET | Freq: Four times a day (QID) | ORAL | 0 refills | Status: DC

## 2017-02-08 MED ORDER — COCONUT OIL OIL: 1.0000 "application " | TOPICAL_OIL | 0 refills | Status: DC | PRN

## 2017-02-08 MED ORDER — OXYCODONE-ACETAMINOPHEN 5-325 MG PO TABS: 1.0000 | ORAL_TABLET | ORAL | 0 refills | Status: DC | PRN

## 2017-02-08 MED ORDER — SENNOSIDES-DOCUSATE SODIUM 8.6-50 MG PO TABS: 2.0000 | ORAL_TABLET | ORAL | Status: DC

## 2017-02-08 MED ORDER — SIMETHICONE 80 MG PO CHEW: 80.0000 mg | CHEWABLE_TABLET | Freq: Three times a day (TID) | ORAL | 0 refills | Status: DC

## 2017-02-08 NOTE — Lactation Note (Signed)
This note was copied from a baby's chart. Lactation Consultation Note Mom sleeping. Baby has 4% wetight loss at 34 hrs of age. Mom started supplementing. Will f/u again. Mom is on d/c home list. Will assess BF. Patient Name: Deanna Robbins MVHQI'OToday's Date: 02/08/2017     Maternal Data    Feeding Feeding Type: Formula Length of feed: 5 min  LATCH Score                   Interventions    Lactation Tools Discussed/Used     Consult Status      Teresha Hanks G 02/08/2017, 7:57 AM

## 2017-02-08 NOTE — Lactation Note (Signed)
This note was copied from a baby's chart. Lactation Consultation Note  Patient Name: Deanna Robbins RVIFB'P Date: 02/08/2017 Reason for consult: Follow-up assessment   Baby 32 hours old and sleeping after circ.   Mother states her milk supply in the past did not come in until day 5. Reviewed hand expression bilaterally, drops expressed. Recommend hand expression before feedings, before and after pumping. Reviewed supply and demand and how breastmilk comes to volume. Assisted mother with pumping using #27 flanges.  Demonstrated how to do hands on pumping. Mom encouraged to feed baby 8-12 times/24 hours and with feeding cues.  Give supplement after breastfeeding if desired to establish milk supply. Reviewed engorgement care and monitoring voids/stools. Mother has personal DEBP at home.  Discussed converting pump kit and taking caps. Offered OP appt.  Mother will call if desired and needed.    Maternal Data    Feeding Feeding Type: Breast Fed Length of feed: 20 min  LATCH Score Latch: Grasps breast easily, tongue down, lips flanged, rhythmical sucking.  Audible Swallowing: A few with stimulation  Type of Nipple: Everted at rest and after stimulation  Comfort (Breast/Nipple): Soft / non-tender  Hold (Positioning): Assistance needed to correctly position infant at breast and maintain latch.  LATCH Score: 8  Interventions Interventions: DEBP;Hand express  Lactation Tools Discussed/Used     Consult Status Consult Status: Complete    Carlye Grippe 02/08/2017, 11:15 AM

## 2017-02-08 NOTE — Discharge Summary (Signed)
OB Discharge Summary     Patient Name: Mika Griffitts DOB: 1985-10-18 MRN: [id number]  Date of admission: 02/06/2017 Delivering MD: MODY, VAISHALI   Date of discharge: 02/08/2017  Admitting diagnosis: INDUCTION Intrauterine pregnancy: [redacted]w[redacted]d     Secondary diagnosis:  Principal Problem:   Postpartum care following cesarean delivery 10/19 Active Problems:   Fetal renal anomaly, single gestation   Cesarean delivery delivered    Discharge diagnosis: Term Pregnancy Delivered                                                                                                Post partum procedures:none  Augmentation: Pitocin and Cytotec  Complications: None  Hospital course:  Induction of Labor With Cesarean Section  31 y.o. yo G3P2011 at [redacted]w[redacted]d was admitted to the hospital 02/06/2017 for induction of labor. Patient had a labor course significant for no progress in labor, vertex but oblique lie and no descent into pelvis. The patient went for cesarean section due to Malpresentation, and delivered a Viable infant, Membrane Rupture Time/Date: )7:55 PM ,02/06/2017  Details of operation can be found in separate operative Note.  Patient had an uncomplicated postpartum course. She is ambulating, tolerating a regular diet, passing flatus, and urinating well.  Patient is discharged home in stable condition on 02/08/17.                                    Physical exam  Vitals:   02/07/17 1245 02/07/17 1645 02/07/17 2009 02/08/17 0448  BP: 115/60 (!) 112/58 (!) 115/59 116/68  Pulse: 70 72 75 83  Resp: 16 18 18 18   Temp: 99 F (37.2 C)  99.1 F (37.3 C) 98.5 F (36.9 C)  TempSrc: Oral  Oral Oral  SpO2: 100% 100%    Weight:      Height:       General: alert, cooperative and no distress Lochia: appropriate Uterine Fundus: firm Incision: Healing well with no significant drainage, Dressing is clean, dry, and intact DVT Evaluation: No cords or calf tenderness. Calf/Ankle edema is  present Labs: Lab Results  Component Value Date   WBC 13.4 (H) 02/07/2017   HGB 11.1 (L) 02/07/2017   HCT 32.9 (L) 02/07/2017   MCV 94.8 02/07/2017   PLT 175 02/07/2017   No flowsheet data found.  Discharge instruction: per After Visit Summary and "Baby and Me Booklet".  After visit meds:  Allergies as of 02/08/2017   No Known Allergies     Medication List    STOP taking these medications   valACYclovir 500 MG tablet Commonly known as:  VALTREX     TAKE these medications   acetaminophen 325 MG tablet Commonly known as:  TYLENOL Take 2 tablets (650 mg total) by mouth every 4 (four) hours as needed (for pain scale < 4).   coconut oil Oil Apply 1 application topically as needed.   ibuprofen 600 MG tablet Commonly known as:  ADVIL,MOTRIN Take 1 tablet (600 mg total) by mouth every 6 (six) hours.  oxyCODONE-acetaminophen 5-325 MG tablet Commonly known as:  PERCOCET/ROXICET Take 1 tablet by mouth every 4 (four) hours as needed (pain scale 4-7).   senna-docusate 8.6-50 MG tablet Commonly known as:  Senokot-S Take 2 tablets by mouth daily.   simethicone 80 MG chewable tablet Commonly known as:  MYLICON Chew 1 tablet (80 mg total) by mouth 3 (three) times daily after meals.   VITAPEARL 30-1.4-200 MG Cpcr Take 1 tablet by mouth daily.       Diet: routine diet  Activity: Advance as tolerated. Pelvic rest for 6 weeks.   Outpatient follow up:6 weeks  Postpartum contraception: Not Discussed  Newborn Data: Live born female [place] Birth Weight: 7 lb 10.6 oz (3475 g) APGAR: 8, 9  Newborn Delivery   Birth date/time:  02/06/2017 19:56:00 Delivery type:  C-Section, Low Transverse  C-section categorization:  Primary     Baby Feeding: Bottle and Breast Disposition:home with mother   02/08/2017 Neta Mendsaniela C Brylon Brenning, CNM

## 2017-02-08 NOTE — Progress Notes (Signed)
Subjective: POD# 2 Information for the patient's newborn:  Deanna Robbins, Boy Deanna Robbins [[id number]][[id number]]  female  circ pending Baby name: Deanna Robbins  Reports feeling well, desires DC home today Feeding: breast & bottle Patient reports tolerating PO.  Breast symptoms: none Pain controlled with PO meds Denies HA/SOB/C/P/N/V/dizziness. Flatus present. She reports vaginal bleeding as normal, without clots.  She is ambulating, urinating without difficulty.     Objective:   VS:    Vitals:   02/07/17 1245 02/07/17 1645 02/07/17 2009 02/08/17 0448  BP: 115/60 (!) 112/58 (!) 115/59 116/68  Pulse: 70 72 75 83  Resp: 16 18 18 18   Temp: 99 F (37.2 C)  99.1 F (37.3 C) 98.5 F (36.9 C)  TempSrc: Oral  Oral Oral  SpO2: 100% 100%    Weight:      Height:         Intake/Output Summary (Last 24 hours) at 02/08/17 [id number] Last data filed at 02/07/17 2009  Gross per 24 hour  Intake                0 ml  Output             1750 ml  Net            -1750 ml        Recent Labs  02/06/17 0130 02/07/17 0524  WBC 11.9* 13.4*  HGB 13.0 11.1*  HCT 37.3 32.9*  PLT 190 175     Blood type: --/--/O POS (10/19 0130)  Rubella: Immune (04/02 0000)     Physical Exam:  General: alert, cooperative and no distress Abdomen: soft, nontender, normal bowel sounds, abdominal laxity Incision: clean, dry and intact Uterine Fundus: firm, below umbilicus, nontender Lochia: minimal Ext: +1 pedal edema, no redness or tenderness in the calves or thighs      Assessment/Plan: 31 y.o.   POD# 2. [id number]                  Principal Problem:   Postpartum care following cesarean delivery 10/19 Active Problems:   Fetal renal anomaly, single gestation   Cesarean delivery delivered   Doing well, stable.               Routine PP care              DC home today w/ instructions  [facility] and [facility]N   Neta Mendsaniela C Shantese Raven, CNM, MSN 02/08/2017, 9:22 AM

## 2019-06-03 ENCOUNTER — Encounter: Payer: Self-pay | Admitting: Adult Health Nurse Practitioner

## 2019-06-03 ENCOUNTER — Telehealth: Payer: Self-pay | Admitting: Adult Health Nurse Practitioner

## 2019-06-03 ENCOUNTER — Other Ambulatory Visit: Payer: Self-pay

## 2019-06-03 ENCOUNTER — Telehealth (INDEPENDENT_AMBULATORY_CARE_PROVIDER_SITE_OTHER): Payer: Self-pay | Admitting: Adult Health Nurse Practitioner

## 2019-06-03 DIAGNOSIS — J209 Acute bronchitis, unspecified: Secondary | ICD-10-CM

## 2019-06-03 HISTORY — DX: Acute bronchitis, unspecified: J20.9

## 2019-06-03 MED ORDER — BECLOMETHASONE DIPROPIONATE 80 MCG/ACT IN AERS: 2.0000 | INHALATION_SPRAY | Freq: Two times a day (BID) | RESPIRATORY_TRACT | 12 refills | Status: DC

## 2019-06-03 MED ORDER — ALBUTEROL SULFATE HFA 108 (90 BASE) MCG/ACT IN AERS: 2.0000 | INHALATION_SPRAY | RESPIRATORY_TRACT | 0 refills | Status: AC | PRN

## 2019-06-03 MED ORDER — ALBUTEROL SULFATE HFA 108 (90 BASE) MCG/ACT IN AERS: 2.0000 | INHALATION_SPRAY | RESPIRATORY_TRACT | 0 refills | Status: DC | PRN

## 2019-06-03 NOTE — Telephone Encounter (Signed)
Please review, meds prescribed today thru tele med.

## 2019-06-03 NOTE — Telephone Encounter (Signed)
Rx has been resent to pharmacy  

## 2019-06-03 NOTE — Progress Notes (Signed)
Telemedicine Encounter- SOAP NOTE Established Patient  This telephone encounter was conducted with the patient's (or proxy's) verbal consent via audio telecommunications: yes/no: Yes Patient was instructed to have this encounter in a suitably private space; and to only have persons present to whom they give permission to participate. In addition, patient identity was confirmed by use of name plus two identifiers (DOB and address).  I discussed the limitations, risks, security and privacy concerns of performing an evaluation and management service by telephone and the availability of in person appointments. I also discussed with the patient that there may be a patient responsible charge related to this service. The patient expressed understanding and agreed to proceed.  I spent a total of TIME; 0 MIN TO 60 MIN: 8 talking with the patient or their proxy.  Chief Complaint  Patient presents with  . Cough    Subjective   Deanna Robbins is a 34 y.o. established patient. Telephone visit today for  HPI  Patient is coming for evaluation of what is been a chronic cough for her lasting almost a month.  She has been seen and evaluated in urgent care and was given 20 mg for 5 days of prednisone and a Z-Pak which did not completely resolve her symptoms.  She is continuing to have a dry, hacking cough and coughing spells.  She really does not want to go back on prednisone.  Chest x-ray recently completed was negative.  She has been tested for Covid recently and is negative.  Denies fevers, chills, night sweats.  No loss of taste or smell.  No headaches.  Patient Active Problem List   Diagnosis Date Noted  . Acute bronchitis 06/03/2019  . Fetal renal anomaly, single gestation 02/06/2017  . Cesarean delivery delivered 02/06/2017  . Postpartum care following cesarean delivery 10/19 11/24/2013    Past Medical History:  Diagnosis Date  . Acute bronchitis 06/03/2019  . Complication of anesthesia     fentanyl made her hot and nauseated  . Family history of congenital anomalies    spina bifida  . Fetal renal anomaly, single gestation 02/06/2017  . Gestational diabetes    last pregnancy  . Gestational diabetes mellitus, antepartum   . Hirsutism   . HSV (herpes simplex virus) anogenital infection   . Hx of varicella   . PCOS (polycystic ovarian syndrome)     Current Outpatient Medications  Medication Sig Dispense Refill  . acetaminophen (TYLENOL) 325 MG tablet Take 2 tablets (650 mg total) by mouth every 4 (four) hours as needed (for pain scale < 4).    . albuterol (VENTOLIN HFA) 108 (90 Base) MCG/ACT inhaler Inhale 2 puffs into the lungs every 4 (four) hours as needed for wheezing or shortness of breath. 8 g 0  . beclomethasone (QVAR) 80 MCG/ACT inhaler Inhale 2 puffs into the lungs 2 (two) times daily. 1 Inhaler 12  . coconut oil OIL Apply 1 application topically as needed.  0  . ibuprofen (ADVIL,MOTRIN) 600 MG tablet Take 1 tablet (600 mg total) by mouth every 6 (six) hours. 30 tablet 0  . oxyCODONE-acetaminophen (PERCOCET/ROXICET) 5-325 MG tablet Take 1 tablet by mouth every 4 (four) hours as needed (pain scale 4-7). 30 tablet 0  . Prenat-FeFum-Fered-FA-DHA w/oA (VITAPEARL) 30-1.4-200 MG CPCR Take 1 tablet by mouth daily.  12  . senna-docusate (SENOKOT-S) 8.6-50 MG tablet Take 2 tablets by mouth daily.    . simethicone (MYLICON) 80 MG chewable tablet Chew 1 tablet (80 mg total) by  mouth 3 (three) times daily after meals. 30 tablet 0   No current facility-administered medications for this visit.    No Known Allergies  Social History   Socioeconomic History  . Marital status: Married    Spouse name: Not on file  . Number of children: Not on file  . Years of education: Not on file  . Highest education level: Not on file  Occupational History  . Not on file  Tobacco Use  . Smoking status: Never Smoker  . Smokeless tobacco: Never Used  Substance and Sexual Activity  .  Alcohol use: No  . Drug use: No  . Sexual activity: Yes    Birth control/protection: None  Other Topics Concern  . Not on file  Social History Narrative  . Not on file   Social Determinants of Health   Financial Resource Strain:   . Difficulty of Paying Living Expenses: Not on file  Food Insecurity:   . Worried About Charity fundraiser in the Last Year: Not on file  . Ran Out of Food in the Last Year: Not on file  Transportation Needs:   . Lack of Transportation (Medical): Not on file  . Lack of Transportation (Non-Medical): Not on file  Physical Activity:   . Days of Exercise per Week: Not on file  . Minutes of Exercise per Session: Not on file  Stress:   . Feeling of Stress : Not on file  Social Connections:   . Frequency of Communication with Friends and Family: Not on file  . Frequency of Social Gatherings with Friends and Family: Not on file  . Attends Religious Services: Not on file  . Active Member of Clubs or Organizations: Not on file  . Attends Archivist Meetings: Not on file  . Marital Status: Not on file  Intimate Partner Violence:   . Fear of Current or Ex-Partner: Not on file  . Emotionally Abused: Not on file  . Physically Abused: Not on file  . Sexually Abused: Not on file    ROS   Review of Systems See HPI Constitution: No fevers or chills No malaise No diaphoresis Skin: No rash or itching Eyes: no blurry vision, no double vision Resp:  + for dry, hacking cough.  GU: no dysuria or hematuria Neuro: no dizziness or headaches   Objective    GEN: WDWN, NAD, Non-toxic, Alert & Oriented x 3  PSYCH: Normally interactive. Conversant. Not depressed or anxious appearing.  Calm demeanor.    Vitals as reported by the patient: There were no vitals filed for this visit.  Deanna Robbins was seen today for cough.  Diagnoses and all orders for this visit:  Acute bronchitis, unspecified organism  Other orders -     albuterol (VENTOLIN HFA) 108  (90 Base) MCG/ACT inhaler; Inhale 2 puffs into the lungs every 4 (four) hours as needed for wheezing or shortness of breath. -     beclomethasone (QVAR) 80 MCG/ACT inhaler; Inhale 2 puffs into the lungs 2 (two) times daily.   Instructed the patient on how to use inhalers.  If these do not work, would recommend a higher dose, longer course of prednisone.  She verbalized understanding.  I discussed the assessment and treatment plan with the patient. The patient was provided an opportunity to ask questions and all were answered. The patient agreed with the plan and demonstrated an understanding of the instructions.   The patient was advised to call back or seek an in-person evaluation  if the symptoms worsen or if the condition fails to improve as anticipated.  I provided 8 minutes of non-face-to-face time during this encounter.  Elyse Jarvis, NP  Primary Care at [facility]

## 2019-06-03 NOTE — Telephone Encounter (Signed)
albuterol (VENTOLIN HFA) 108 (90 Base) MCG/ACT inhaler beclomethasone (QVAR) 80 MCG/ACT inhaler   Pt stated her rx's went to the wrong pharmacy. Requesting them to be transferred new pharmacy today  Karin Golden at [facility] [address], [place] - [address] [employer] Phone:  [phone]  Fax:  [phone]

## 2019-06-03 NOTE — Telephone Encounter (Signed)
I have resent the medication to [facility].

## 2019-06-03 NOTE — Patient Instructions (Signed)
° ° ° °  If you have lab work done today you will be contacted with your lab results within the next 2 weeks.  If you have not heard from us then please contact us. The fastest way to get your results is to register for My Chart. ° ° °IF you received an x-ray today, you will receive an invoice from [facility]. Please contact [facility] at [phone] with questions or concerns regarding your invoice.  ° °IF you received labwork today, you will receive an invoice from [facility]. Please contact [facility] at [phone] with questions or concerns regarding your invoice.  ° °Our billing staff will not be able to assist you with questions regarding bills from these companies. ° °You will be contacted with the lab results as soon as they are available. The fastest way to get your results is to activate your My Chart account. Instructions are located on the last page of this paperwork. If you have not heard from us regarding the results in 2 weeks, please contact this office. °  ° ° ° °

## 2019-06-08 ENCOUNTER — Telehealth: Payer: Self-pay | Admitting: Adult Health Nurse Practitioner

## 2019-06-08 NOTE — Telephone Encounter (Signed)
Patient stated that insurance company will not cover beclomethasone (QVAR) 80 MCG/ACT inhaler  They stated they have another that's preferred but patient does not know name. Please advise.

## 2019-06-14 NOTE — Telephone Encounter (Signed)
LVM to let the pt know to contact her insurance company to find out what other medication is covered that is close to the medication beclomethasone.

## 2019-06-15 ENCOUNTER — Ambulatory Visit: Payer: No Typology Code available for payment source | Admitting: Adult Health Nurse Practitioner

## 2019-06-15 ENCOUNTER — Other Ambulatory Visit: Payer: Self-pay

## 2019-06-15 VITALS — BP 126/80 | HR 98 | Temp 98.2°F | Ht 62.0 in | Wt 159.4 lb

## 2019-06-15 DIAGNOSIS — J209 Acute bronchitis, unspecified: Secondary | ICD-10-CM

## 2019-06-15 DIAGNOSIS — Z23 Encounter for immunization: Secondary | ICD-10-CM | POA: Diagnosis not present

## 2019-06-15 MED ORDER — BUDESONIDE-FORMOTEROL FUMARATE 80-4.5 MCG/ACT IN AERO: 2.0000 | INHALATION_SPRAY | Freq: Two times a day (BID) | RESPIRATORY_TRACT | 3 refills | Status: DC

## 2019-06-15 MED ORDER — DOXYCYCLINE HYCLATE 100 MG PO TABS: 100.0000 mg | ORAL_TABLET | Freq: Two times a day (BID) | ORAL | 0 refills | Status: DC

## 2019-06-15 NOTE — Patient Instructions (Signed)
° ° ° °  If you have lab work done today you will be contacted with your lab results within the next 2 weeks.  If you have not heard from us then please contact us. The fastest way to get your results is to register for My Chart. ° ° °IF you received an x-ray today, you will receive an invoice from [facility]. Please contact [facility] at [phone] with questions or concerns regarding your invoice.  ° °IF you received labwork today, you will receive an invoice from [facility]. Please contact [facility] at [phone] with questions or concerns regarding your invoice.  ° °Our billing staff will not be able to assist you with questions regarding bills from these companies. ° °You will be contacted with the lab results as soon as they are available. The fastest way to get your results is to activate your My Chart account. Instructions are located on the last page of this paperwork. If you have not heard from us regarding the results in 2 weeks, please contact this office. °  ° ° ° °

## 2019-06-28 ENCOUNTER — Encounter: Payer: Self-pay | Admitting: Adult Health Nurse Practitioner

## 2019-06-28 NOTE — Progress Notes (Signed)
   06/28/2019  [name] 03-20-1986 [id number]    SUBJECTIVE:  Deanna Robbins is a 34 y.o. female who complains of congestion, post nasal drip and dry cough for 1 month. . She denies a history of shortness of breath, sweats and weakness. She denies a history of asthma. Patient does not smoke cigarettes.   OBJECTIVE: Vitals as noted above. Appearance: alert, well appearing, and in no distress.  ENT- ENT exam normal, no neck nodes or sinus tenderness.  Chest - no tachypnea, retractions or cyanosis, wheezing noted bilateral, expiratory .  ASSESSMENT:  bronchitis  PLAN: Symptomatic therapy suggested: push fluids, rest, gargle warm salt water, return office visit prn if symptoms persist or worsen and recommended steroids if symptoms persist or worsen. . Call or return to clinic prn if these symptoms worsen or fail to improve as anticipated.  Meds ordered this encounter  Medications  . budesonide-formoterol (SYMBICORT) 80-4.5 MCG/ACT inhaler    Sig: Inhale 2 puffs into the lungs 2 (two) times daily.    Dispense:  1 Inhaler    Refill:  3  . doxycycline (VIBRA-TABS) 100 MG tablet    Sig: Take 1 tablet (100 mg total) by mouth 2 (two) times daily.    Dispense:  20 tablet    Refill:  0

## 2019-08-24 ENCOUNTER — Ambulatory Visit: Payer: No Typology Code available for payment source | Admitting: Adult Health Nurse Practitioner

## 2019-08-24 ENCOUNTER — Telehealth: Payer: Self-pay | Admitting: Adult Health Nurse Practitioner

## 2019-08-24 NOTE — Telephone Encounter (Signed)
Called this pt to cancel and reschedule the number is no longer in service / unable to contact

## 2019-08-25 ENCOUNTER — Ambulatory Visit: Payer: No Typology Code available for payment source | Admitting: Adult Health Nurse Practitioner

## 2019-08-26 ENCOUNTER — Ambulatory Visit: Payer: No Typology Code available for payment source | Admitting: Family Medicine

## 2019-10-28 ENCOUNTER — Encounter: Payer: Self-pay | Admitting: Adult Health Nurse Practitioner

## 2019-12-22 ENCOUNTER — Telehealth: Payer: No Typology Code available for payment source | Admitting: Family Medicine

## 2019-12-22 ENCOUNTER — Encounter: Payer: Self-pay | Admitting: Family Medicine

## 2019-12-22 ENCOUNTER — Ambulatory Visit: Payer: No Typology Code available for payment source | Admitting: Family Medicine

## 2019-12-22 ENCOUNTER — Other Ambulatory Visit: Payer: Self-pay

## 2019-12-22 VITALS — BP 124/70 | HR 83 | Temp 98.2°F | Resp 15 | Ht 62.0 in | Wt 167.0 lb

## 2019-12-22 DIAGNOSIS — G44209 Tension-type headache, unspecified, not intractable: Secondary | ICD-10-CM | POA: Diagnosis not present

## 2019-12-22 MED ORDER — BUTALBITAL-APAP-CAFFEINE 50-325-40 MG PO TABS: 1.0000 | ORAL_TABLET | Freq: Four times a day (QID) | ORAL | 0 refills | Status: AC | PRN

## 2019-12-22 NOTE — Progress Notes (Signed)
Patient ID: Deanna Robbins, female    DOB: 10-24-1985  Age: 34 y.o. MRN: [id number]  Chief Complaint  Patient presents with  . Headache    pt has headache x 2 days she is not prone to migraines, denies light sensitivity but notes possible sound sensitivity, pt has been trying tylenol and ibuprofen with little to no releief.     Subjective:   34 year old lady with a history of having a headache onset yesterday.  She has been in Social worker school, has 2 small children at home.  She had a court session yesterday and pain in the top right portion of her head.  It was pretty severe and sudden.  Unusual for her.  It waxed and waned.  It she was aware of it when she woke up in the nighttime.  She has continued having some headache today.  She did take Tylenol yesterday and again today.  No nausea or vomiting.  No other neurologic symptoms.  She rarely has headaches, and has been a long time that she has to take anything for headache.  Her menstrual cycles are irregular.  She does not feel the need for [place] to do a pregnancy test on her today, is confident she is not pregnant.    No family history of migraine.   Current allergies, medications, problem list, past/family and social histories reviewed.  Objective:  BP 124/70   Pulse 83   Temp 98.2 F (36.8 C) (Temporal)   Resp 15   Ht 5\' 2"  (1.575 m)   Wt 167 lb (75.8 kg)   SpO2 100%   BMI 30.54 kg/m   Pleasant lady, alert and oriented.  Not in any apparent severe distress today.  TMs normal.  Eyes EOMs intact.  Neck supple without nodes.  No scalp tenderness.  Chest clear.  Heart rate without murmurs.  Cranial nerves grossly intact.  Motor strength symmetrical.  Finger-to-nose normal.  Gait normal.  Romberg negative.  Assessment & Plan:   Assessment: 1. Acute non intractable tension-type headache       Plan: Could be a migraine, but she does not give any history of migraines.  I suspect this is from the stress she carries in life.  If  worse she is to get reevaluated further and she understands that.  See instructions.  No orders of the defined types were placed in this encounter.   Meds ordered this encounter  Medications  . butalbital-acetaminophen-caffeine (FIORICET) 50-325-40 MG tablet    Sig: Take 1-2 tablets by mouth every 6 (six) hours as needed for headache.    Dispense:  24 tablet    Refill:  0         Patient Instructions     Take Fioricet 1 to 2 pills every 6 hours as needed for headache.  Do not exceed 6 pills in 24 hours.  In addition to this, you can take ibuprofen 200 mg 3 pills every 6 or 8 hours or Aleve 2 pills twice daily (1 or the other, not both).  If you should take any Tylenol (acetaminophen) in addition to this, be cautious because the Fioricet does have 300 mg of acetaminophen in it.  You should not exceed a total of 3000 mg of acetaminophen in 24 hours.  In the event of acute worsening of the headache or other associated neurologic symptoms as we discussed, go to the emergency room.  If you keep having frequent headaches, please return for further assessment and longer-term treatment options.  If you have lab work done today you will be contacted with your lab results within the next 2 weeks.  If you have not heard from [place] then please contact us. The fastest way to get your results is to register for My Chart.   IF you received an x-ray today, you will receive an invoice from [facility]. Please contact [facility] at [phone] with questions or concerns regarding your invoice.   IF you received labwork today, you will receive an invoice from [place] [facility]. Please contact [facility] at [phone] with questions or concerns regarding your invoice.   Our billing staff will not be able to assist you with questions regarding bills from these companies.  You will be contacted with the lab results as soon as they are available. The fastest way to get your results is  to activate your My Chart account. Instructions are located on the last page of this paperwork. If you have not heard from [place] regarding the results in 2 weeks, please contact this office.        Return if symptoms worsen or fail to improve.   Janace Hoard, MD 12/22/2019

## 2019-12-22 NOTE — Patient Instructions (Addendum)
   Take Fioricet 1 to 2 pills every 6 hours as needed for headache.  Do not exceed 6 pills in 24 hours.  In addition to this, you can take ibuprofen 200 mg 3 pills every 6 or 8 hours or Aleve 2 pills twice daily (1 or the other, not both).  If you should take any Tylenol (acetaminophen) in addition to this, be cautious because the Fioricet does have 300 mg of acetaminophen in it.  You should not exceed a total of 3000 mg of acetaminophen in 24 hours.  In the event of acute worsening of the headache or other associated neurologic symptoms as we discussed, go to the emergency room.  If you keep having frequent headaches, please return for further assessment and longer-term treatment options.    If you have lab work done today you will be contacted with your lab results within the next 2 weeks.  If you have not heard from [place] then please contact us. The fastest way to get your results is to register for My Chart.   IF you received an x-ray today, you will receive an invoice from [facility]. Please contact [facility] at [phone] with questions or concerns regarding your invoice.   IF you received labwork today, you will receive an invoice from [place]. Please contact [facility] at [phone] with questions or concerns regarding your invoice.   Our billing staff will not be able to assist you with questions regarding bills from these companies.  You will be contacted with the lab results as soon as they are available. The fastest way to get your results is to activate your My Chart account. Instructions are located on the last page of this paperwork. If you have not heard from [place] regarding the results in 2 weeks, please contact this office.

## 2019-12-24 ENCOUNTER — Other Ambulatory Visit: Payer: Self-pay

## 2019-12-24 ENCOUNTER — Encounter (HOSPITAL_BASED_OUTPATIENT_CLINIC_OR_DEPARTMENT_OTHER): Payer: Self-pay | Admitting: Emergency Medicine

## 2019-12-24 DIAGNOSIS — Z79899 Other long term (current) drug therapy: Secondary | ICD-10-CM | POA: Diagnosis not present

## 2019-12-24 DIAGNOSIS — G4485 Primary stabbing headache: Secondary | ICD-10-CM | POA: Diagnosis not present

## 2019-12-24 DIAGNOSIS — R519 Headache, unspecified: Secondary | ICD-10-CM | POA: Diagnosis present

## 2019-12-24 NOTE — ED Triage Notes (Signed)
C/O consistent HA since Wednesday. Saw her PCP on Thurs, was given Fiorcet that she says has helped with frequency but not pain. Endorses watery eyes and feels she sees flashing light in her peripheral vision, photophobia and sensitivity to sound. Pinpoints pain to top R side of head "like a spasm". Denies other neuro sx.

## 2019-12-25 ENCOUNTER — Emergency Department (HOSPITAL_BASED_OUTPATIENT_CLINIC_OR_DEPARTMENT_OTHER)
Admission: EM | Admit: 2019-12-25 | Discharge: 2019-12-25 | Disposition: A | Discharge status: Home / Self Care | Payer: No Typology Code available for payment source | Attending: Emergency Medicine | Admitting: Emergency Medicine

## 2019-12-25 ENCOUNTER — Emergency Department (HOSPITAL_BASED_OUTPATIENT_CLINIC_OR_DEPARTMENT_OTHER): Payer: No Typology Code available for payment source

## 2019-12-25 DIAGNOSIS — G4485 Primary stabbing headache: Secondary | ICD-10-CM

## 2019-12-25 LAB — PREGNANCY, URINE: Preg Test, Ur: NEGATIVE

## 2019-12-25 MED ORDER — DEXAMETHASONE SODIUM PHOSPHATE 10 MG/ML IJ SOLN
10.0000 mg | Freq: Once | INTRAMUSCULAR | Status: AC
  Administered 2019-12-25: 10 mg via INTRAVENOUS
  Filled 2019-12-25: qty 1

## 2019-12-25 MED ORDER — DIPHENHYDRAMINE HCL 50 MG/ML IJ SOLN
25.0000 mg | Freq: Once | INTRAMUSCULAR | Status: AC
  Administered 2019-12-25: 25 mg via INTRAVENOUS
  Filled 2019-12-25: qty 1

## 2019-12-25 MED ORDER — IBUPROFEN 400 MG PO TABS
400.0000 mg | ORAL_TABLET | Freq: Once | ORAL | Status: AC | PRN
  Administered 2019-12-25: 400 mg via ORAL
  Filled 2019-12-25: qty 1

## 2019-12-25 MED ORDER — KETOROLAC TROMETHAMINE 15 MG/ML IJ SOLN
15.0000 mg | Freq: Once | INTRAMUSCULAR | Status: AC
  Administered 2019-12-25: 15 mg via INTRAVENOUS
  Filled 2019-12-25: qty 1

## 2019-12-25 MED ORDER — METOCLOPRAMIDE HCL 5 MG/ML IJ SOLN
10.0000 mg | Freq: Once | INTRAMUSCULAR | Status: AC
  Administered 2019-12-25: 10 mg via INTRAVENOUS
  Filled 2019-12-25: qty 2

## 2019-12-25 MED ORDER — SODIUM CHLORIDE 0.9 % IV BOLUS
1000.0000 mL | Freq: Once | INTRAVENOUS | Status: AC
  Administered 2019-12-25: 1000 mL via INTRAVENOUS

## 2019-12-25 NOTE — ED Provider Notes (Signed)
[facility] Provider Note: [name], MD, FACEP  CSN: [id number] MRN: [id number] ARRIVAL: 12/24/19 at 2005 ROOM: MH04/MH04   CHIEF COMPLAINT  Headache   HISTORY OF PRESENT ILLNESS  12/25/19 3:01 AM Deanna Robbins is a 34 y.o. female who has been having headaches for the past 4 days.  The headaches occur in the right occipitoparietal area.  She describes the pain as a sudden, sharp stabbing pain that lasts only about a second.  Nothing brings them on.  She had been taking Tylenol and ibuprofen which seemed to reduce their frequency but not their severity.  Currently they are occurring about every 14 minutes.  She is having no associated nausea, vomiting, or focal neurologic changes.  She has had some photophobia, photophobia and scotomata since yesterday.  She was seen by her PCP and prescribed Fioricet which also helped reduce their frequency.   Past Medical History:  Diagnosis Date  . Acute bronchitis 06/03/2019  . Complication of anesthesia    fentanyl made her hot and nauseated  . Family history of congenital anomalies    spina bifida  . Fetal renal anomaly, single gestation 02/06/2017  . Gestational diabetes    last pregnancy  . Gestational diabetes mellitus, antepartum   . Hirsutism   . HSV (herpes simplex virus) anogenital infection   . Hx of varicella   . PCOS (polycystic ovarian syndrome)     Past Surgical History:  Procedure Laterality Date  . CESAREAN SECTION N/A 02/06/2017   Procedure: CESAREAN SECTION;  Surgeon: Shea Evans, MD;  Location: [facility] BIRTHING SUITES;  Service: Obstetrics;  Laterality: N/A;  . DILATION AND CURETTAGE OF UTERUS    . TONSILLECTOMY    . TONSILLECTOMY      Family History  Problem Relation Age of Onset  . Diabetes Father   . Stroke Father   . Sleep apnea Father   . Spina bifida Other        nephew    Social History   Tobacco Use  . Smoking status: Never Smoker  . Smokeless tobacco: Never Used  Substance  Use Topics  . Alcohol use: No  . Drug use: No    Prior to Admission medications   Medication Sig Start Date End Date Taking? Authorizing Provider  acetaminophen (TYLENOL) 325 MG tablet Take 2 tablets (650 mg total) by mouth every 4 (four) hours as needed (for pain scale < 4). 02/08/17   Neta Mends, CNM  albuterol (VENTOLIN HFA) 108 (90 Base) MCG/ACT inhaler Inhale 2 puffs into the lungs every 4 (four) hours as needed for wheezing or shortness of breath. 06/03/19 07/03/19  Royal Hawthorn, NP  butalbital-acetaminophen-caffeine (FIORICET) 660 264 0844 MG tablet Take 1-2 tablets by mouth every 6 (six) hours as needed for headache. 12/22/19 12/21/20  Peyton Najjar, MD  Prenat-FeFum-Fered-FA-DHA w/oA Izora Gala) 30-1.4-200 MG CPCR Take 1 tablet by mouth daily. 01/20/17   [provider]    Allergies Patient has no known allergies.   REVIEW OF SYSTEMS  Negative except as noted here or in the History of Present Illness.   PHYSICAL EXAMINATION  Initial Vital Signs Blood pressure (!) 147/89, pulse 63, temperature 98.3 F (36.8 C), temperature source Oral, resp. rate 18, height 5\' 2"  (1.575 m), weight 74.8 kg, last menstrual period 09/23/2019, SpO2 100 %, unknown if currently breastfeeding.  Examination General: Well-developed, well-nourished female in no acute distress; appearance consistent with age of record HENT: normocephalic; atraumatic; no scalp tenderness; no facial tenderness Eyes: pupils  equal, round and reactive to light; extraocular muscles intact Neck: supple Heart: regular rate and rhythm Lungs: clear to auscultation bilaterally Abdomen: soft; nondistended; nontender; bowel sounds present Extremities: No deformity; full range of motion; pulses normal Neurologic: Awake, alert and oriented; motor function intact in all extremities and symmetric; no facial droop; normal coordination, speech and gait Skin: Warm and dry Psychiatric: Normal mood and affect   RESULTS   Summary of this visit's results, reviewed and interpreted by myself:   EKG Interpretation  Date/Time:    Ventricular Rate:    PR Interval:    QRS Duration:   QT Interval:    QTC Calculation:   R Axis:     Text Interpretation:        Laboratory Studies: Results for orders placed or performed during the hospital encounter of 12/25/19 (from the past 24 hour(s))  Pregnancy, urine     Status: None   Collection Time: 12/25/19  2:51 AM  Result Value Ref Range   Preg Test, Ur NEGATIVE NEGATIVE   Imaging Studies: CT Head Wo Contrast  Result Date: 12/25/2019 CLINICAL DATA:  Headache EXAM: CT HEAD WITHOUT CONTRAST TECHNIQUE: Contiguous axial images were obtained from the base of the skull through the vertex without intravenous contrast. COMPARISON:  None. FINDINGS: Brain: No acute intracranial abnormality. Specifically, no hemorrhage, hydrocephalus, mass lesion, acute infarction, or significant intracranial injury. Vascular: No hyperdense vessel or unexpected calcification. Skull: No acute calvarial abnormality. Sinuses/Orbits: Visualized paranasal sinuses and mastoids clear. Orbital soft tissues unremarkable. Other: None IMPRESSION: Normal study. Electronically Signed   By: Charlett Nose M.D.   On: 12/25/2019 03:11    ED COURSE and MDM  Nursing notes, initial and subsequent vitals signs, including pulse oximetry, reviewed and interpreted by myself.  Vitals:   12/25/19 0117 12/25/19 0247 12/25/19 0404 12/25/19 0405  BP: 134/90 (!) 147/89 137/88   Pulse: 64 63  63  Resp: 18 18    Temp: 98.2 F (36.8 C) 98.3 F (36.8 C)    TempSrc: Oral Oral    SpO2: 99% 100%  100%  Weight:      Height:       Medications  ibuprofen (ADVIL) tablet 400 mg (400 mg Oral Given 12/25/19 0123)  sodium chloride 0.9 % bolus 1,000 mL (1,000 mLs Intravenous New Bag/Given 12/25/19 0401)  diphenhydrAMINE (BENADRYL) injection 25 mg (25 mg Intravenous Given 12/25/19 0403)  metoCLOPramide (REGLAN) injection 10 mg (10 mg  Intravenous Given 12/25/19 0403)  ketorolac (TORADOL) 15 MG/ML injection 15 mg (15 mg Intravenous Given 12/25/19 0402)  dexamethasone (DECADRON) injection 10 mg (10 mg Intravenous Given 12/25/19 0402)   4:53 AM Patient feeling better after IV fluids and medications.  Her headaches are of an atypical nature she was advised to follow-up with her PCP if symptoms persist.   PROCEDURES  Procedures   ED DIAGNOSES     ICD-10-CM   1. Primary stabbing headache  G44.85        Judie Hollick, Jonny Ruiz, MD 12/25/19 [phone]

## 2022-02-03 IMAGING — CT CT HEAD W/O CM
3 series · 15 of 47 positions shown, 18 images · non-contrast
Comparison: None.

CLINICAL DATA: Headache

EXAM:
CT HEAD WITHOUT CONTRAST
TECHNIQUE: Contiguous axial images were obtained from the base of the skull
through the vertex without intravenous contrast.

[Series 2: head wo · axial · 0.44mm/px · z∈[+942,+1066]mm · 9 of 31 slices shown, 12 images]
[im 3/31  brain]
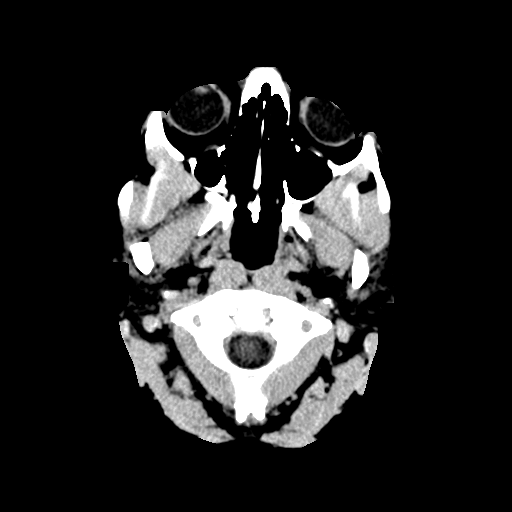
[im 3/31  bone]
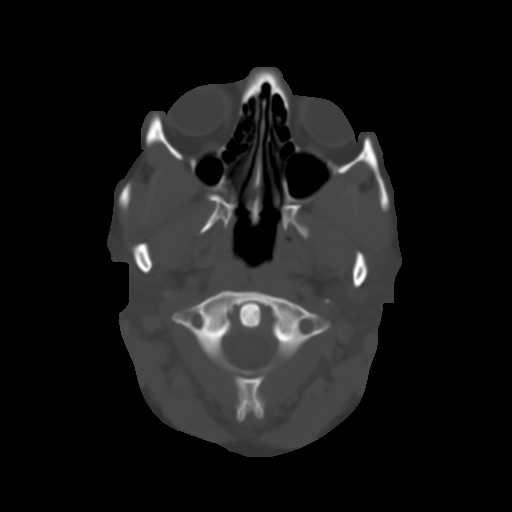
[im 6/31  brain]
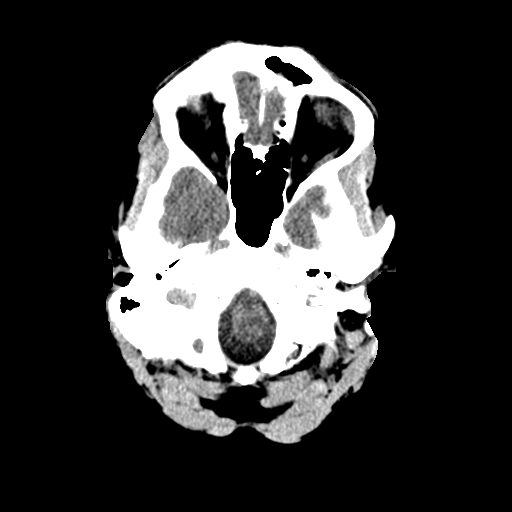
[im 9/31  brain]
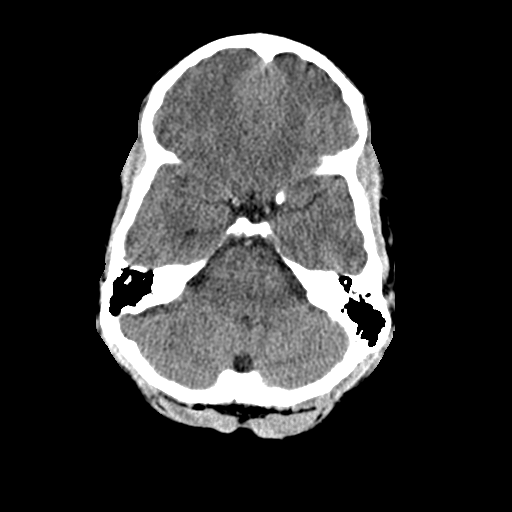
[im 12/31  brain]
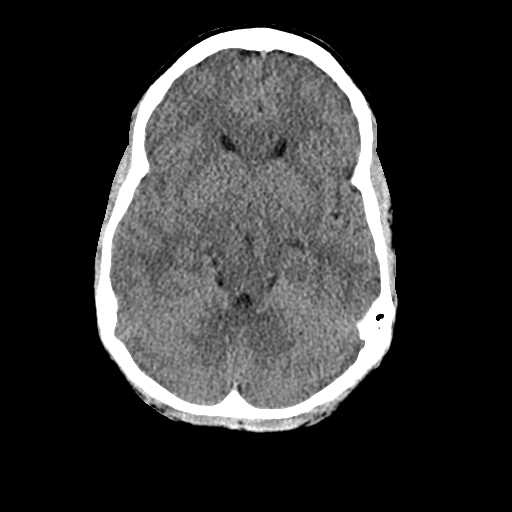
[im 16/31  brain]
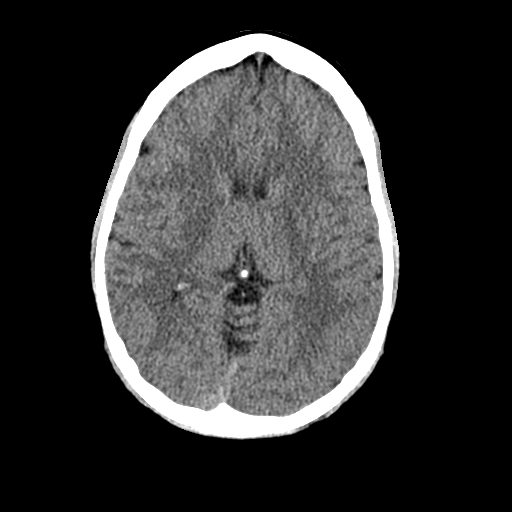
[im 16/31  bone]
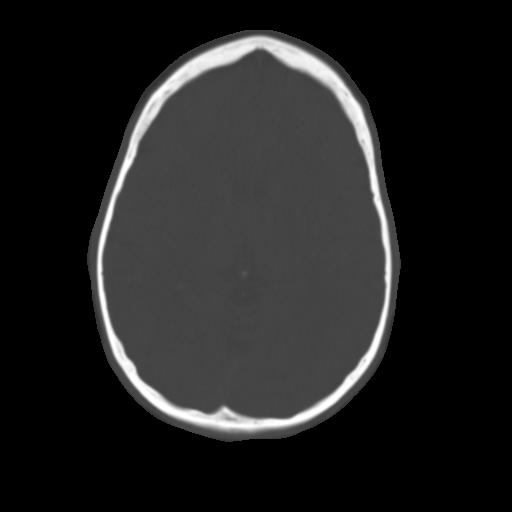
[im 19/31  brain]
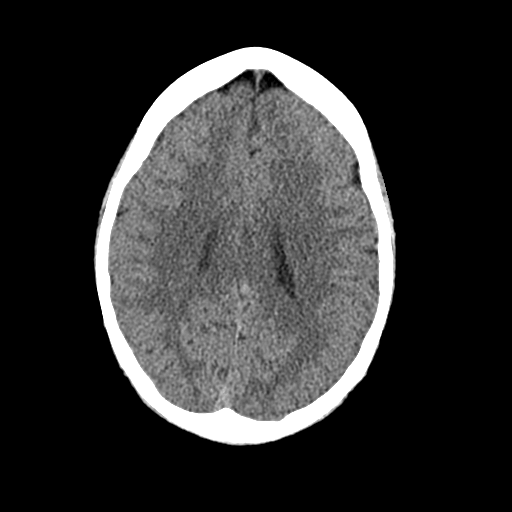
[im 22/31  brain]
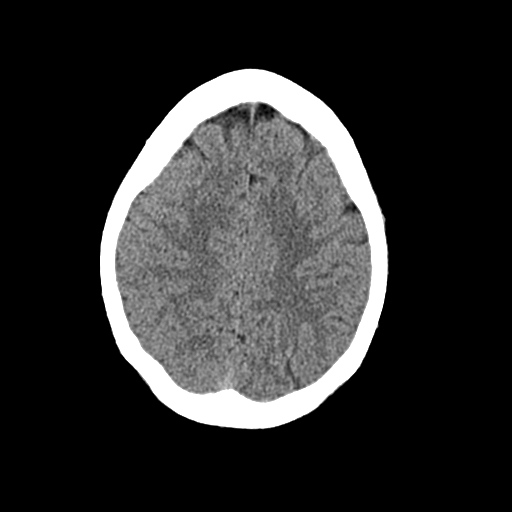
[im 25/31  brain]
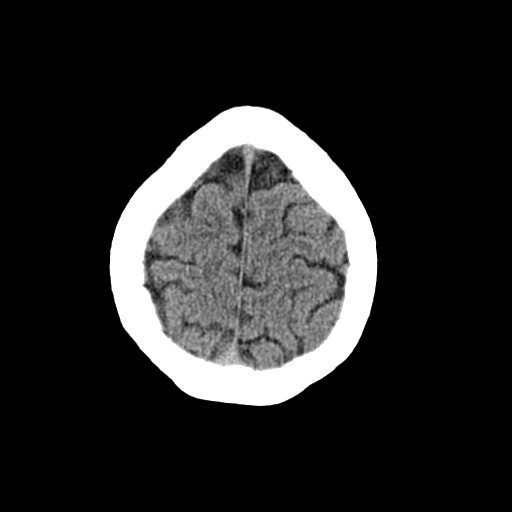
[im 28/31  brain]
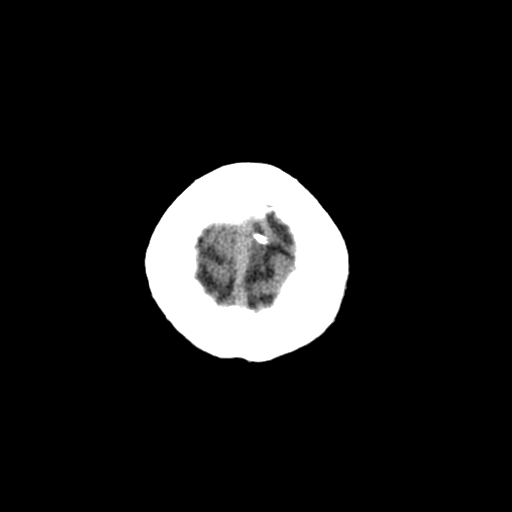
[im 28/31  bone]
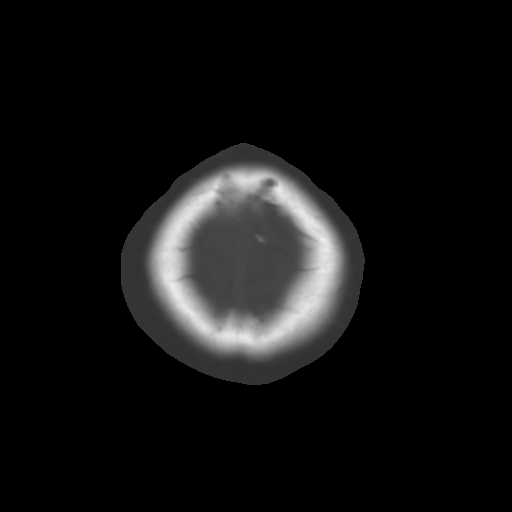

[Series 4: cor soft · coronal · 0.30mm/px · 3 of 73 slices shown]
[im 25/73  brain]
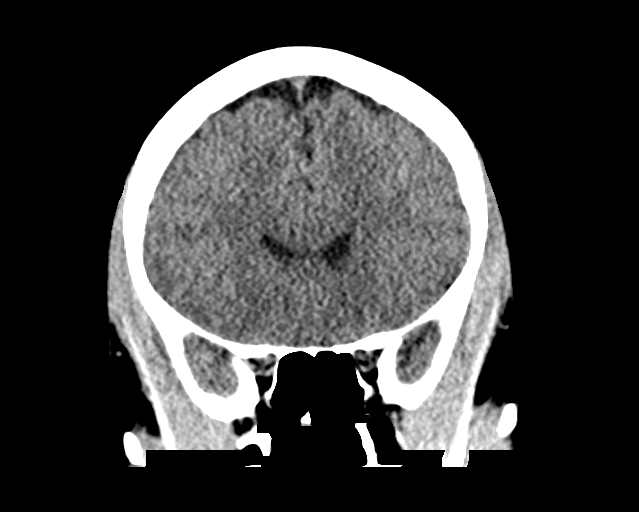
[im 33/73  brain]
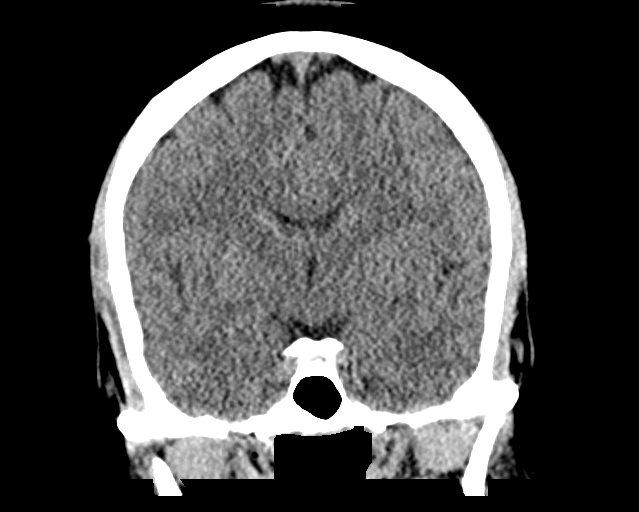
[im 41/73  brain]
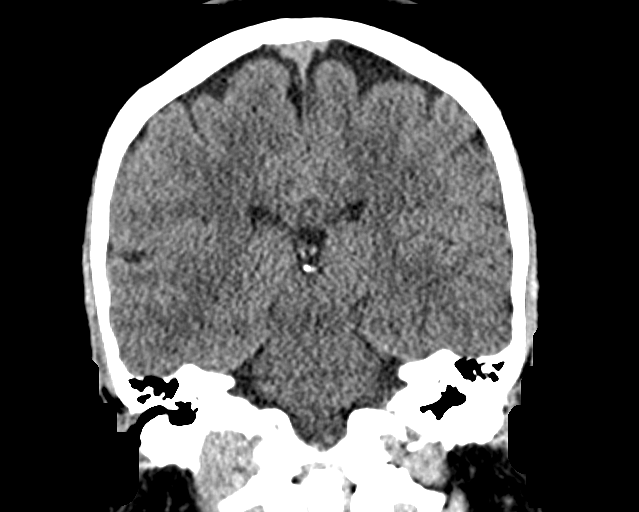

[Series 5: sag soft · sagittal · 0.33mm/px · 3 of 60 slices shown]
[im 20/60  brain]
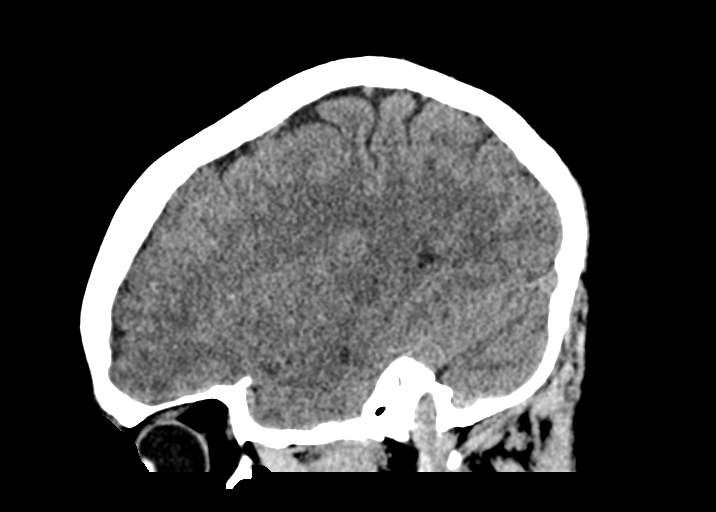
[im 30/60  brain]
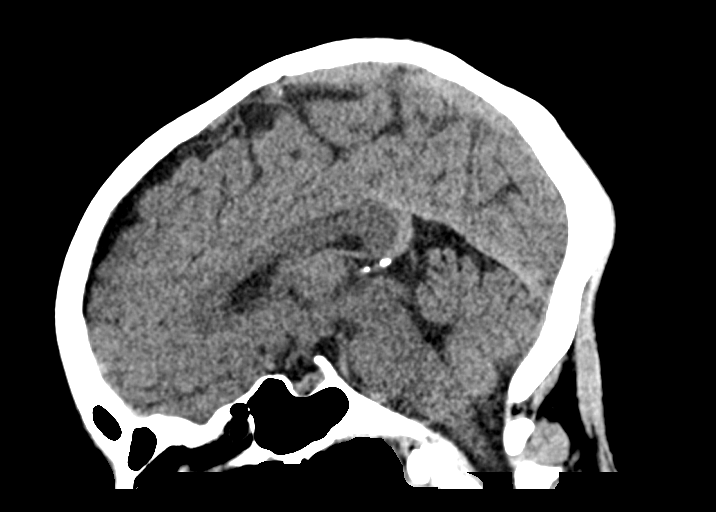
[im 40/60  brain]
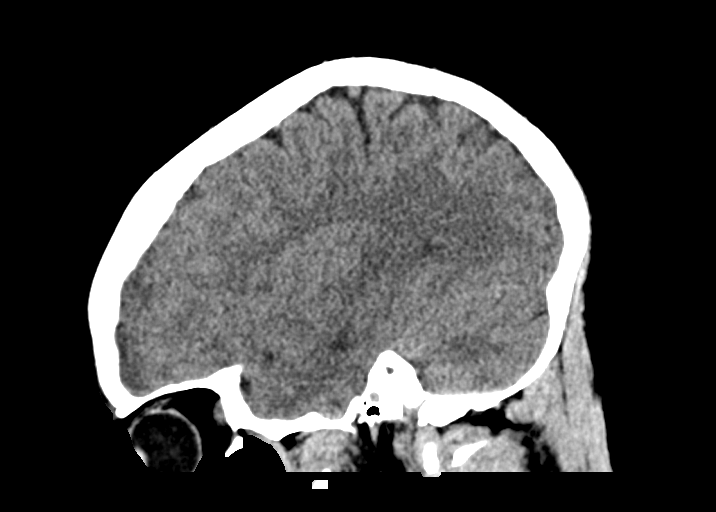

[15 of 47 positions shown; findings below may reference images not displayed]

FINDINGS: Brain: No acute intracranial abnormality. Specifically, no
hemorrhage, hydrocephalus, mass lesion, acute infarction, or
significant intracranial injury.

Vascular: No hyperdense vessel or unexpected calcification.

Skull: No acute calvarial abnormality.

Sinuses/Orbits: Visualized paranasal sinuses and mastoids clear.
Orbital soft tissues unremarkable.

Other: None
IMPRESSION: Normal study.
